# Patient Record
Sex: Female | Born: 1955 | Race: White | Hispanic: No | Marital: Married | Smoking: Never smoker
Health system: Southern US, Community
[De-identification: ages and names within clinical notes are randomized; demographics above are authoritative.]

## PROBLEM LIST (undated history)

## (undated) DIAGNOSIS — E039 Hypothyroidism, unspecified: Secondary | ICD-10-CM

## (undated) DIAGNOSIS — Z820 Family history of epilepsy and other diseases of the nervous system: Secondary | ICD-10-CM

## (undated) DIAGNOSIS — L719 Rosacea, unspecified: Secondary | ICD-10-CM

## (undated) DIAGNOSIS — Z79890 Hormone replacement therapy (postmenopausal): Secondary | ICD-10-CM

## (undated) DIAGNOSIS — C439 Malignant melanoma of skin, unspecified: Secondary | ICD-10-CM

## (undated) DIAGNOSIS — J383 Other diseases of vocal cords: Secondary | ICD-10-CM

## (undated) DIAGNOSIS — C189 Malignant neoplasm of colon, unspecified: Secondary | ICD-10-CM

## (undated) DIAGNOSIS — I499 Cardiac arrhythmia, unspecified: Secondary | ICD-10-CM

## (undated) HISTORY — DX: Rosacea, unspecified: L71.9

## (undated) HISTORY — DX: Cardiac arrhythmia, unspecified: I49.9

## (undated) HISTORY — DX: Family history of epilepsy and other diseases of the nervous system: Z82.0

## (undated) HISTORY — DX: Hormone replacement therapy: Z79.890

## (undated) HISTORY — DX: Other diseases of vocal cords: J38.3

## (undated) HISTORY — DX: Malignant melanoma of skin, unspecified: C43.9

## (undated) HISTORY — PX: ENDOMETRIAL ABLATION: SHX621

## (undated) HISTORY — DX: Malignant neoplasm of colon, unspecified: C18.9

## (undated) HISTORY — PX: SKIN CANCER EXCISION: SHX779

## (undated) HISTORY — PX: VOCAL CORD INJECTION: SHX2663

## (undated) HISTORY — PX: TUBAL LIGATION: SHX77

## (undated) HISTORY — DX: Hypothyroidism, unspecified: E03.9

---

## 1987-08-03 HISTORY — PX: CHOLECYSTECTOMY: SHX55

## 2004-08-02 HISTORY — PX: OTHER SURGICAL HISTORY: SHX169

## 2011-05-13 ENCOUNTER — Other Ambulatory Visit (HOSPITAL_COMMUNITY)
Admission: RE | Admit: 2011-05-13 | Discharge: 2011-05-13 | Disposition: A | Discharge status: Home / Self Care | Payer: BC Managed Care – PPO | Source: Ambulatory Visit | Attending: Obstetrics and Gynecology | Admitting: Obstetrics and Gynecology

## 2011-05-13 ENCOUNTER — Other Ambulatory Visit: Payer: Self-pay | Admitting: Adult Health

## 2011-05-13 DIAGNOSIS — Z01419 Encounter for gynecological examination (general) (routine) without abnormal findings: Secondary | ICD-10-CM | POA: Insufficient documentation

## 2011-05-13 DIAGNOSIS — Z139 Encounter for screening, unspecified: Secondary | ICD-10-CM

## 2011-05-25 ENCOUNTER — Ambulatory Visit (HOSPITAL_COMMUNITY): Payer: Self-pay

## 2011-05-25 ENCOUNTER — Ambulatory Visit (HOSPITAL_COMMUNITY)
Admission: RE | Admit: 2011-05-25 | Discharge: 2011-05-25 | Disposition: A | Discharge status: Home / Self Care | Payer: BC Managed Care – PPO | Source: Ambulatory Visit | Attending: Adult Health | Admitting: Adult Health

## 2011-05-25 DIAGNOSIS — Z1231 Encounter for screening mammogram for malignant neoplasm of breast: Secondary | ICD-10-CM | POA: Insufficient documentation

## 2011-05-25 DIAGNOSIS — Z139 Encounter for screening, unspecified: Secondary | ICD-10-CM

## 2012-05-18 ENCOUNTER — Other Ambulatory Visit (HOSPITAL_COMMUNITY)
Admission: RE | Admit: 2012-05-18 | Discharge: 2012-05-18 | Disposition: A | Discharge status: Home / Self Care | Payer: BC Managed Care – PPO | Source: Ambulatory Visit | Attending: Obstetrics and Gynecology | Admitting: Obstetrics and Gynecology

## 2012-05-18 DIAGNOSIS — Z1151 Encounter for screening for human papillomavirus (HPV): Secondary | ICD-10-CM | POA: Insufficient documentation

## 2012-05-18 DIAGNOSIS — Z01419 Encounter for gynecological examination (general) (routine) without abnormal findings: Secondary | ICD-10-CM | POA: Insufficient documentation

## 2012-05-22 ENCOUNTER — Other Ambulatory Visit: Payer: Self-pay | Admitting: Adult Health

## 2012-05-22 DIAGNOSIS — Z139 Encounter for screening, unspecified: Secondary | ICD-10-CM

## 2012-05-30 ENCOUNTER — Ambulatory Visit (HOSPITAL_COMMUNITY)
Admission: RE | Admit: 2012-05-30 | Discharge: 2012-05-30 | Disposition: A | Discharge status: Home / Self Care | Payer: BC Managed Care – PPO | Source: Ambulatory Visit | Attending: Adult Health | Admitting: Adult Health

## 2012-05-30 DIAGNOSIS — Z139 Encounter for screening, unspecified: Secondary | ICD-10-CM

## 2012-05-30 DIAGNOSIS — Z1231 Encounter for screening mammogram for malignant neoplasm of breast: Secondary | ICD-10-CM | POA: Insufficient documentation

## 2013-03-07 ENCOUNTER — Encounter: Payer: Self-pay | Admitting: *Deleted

## 2013-03-12 ENCOUNTER — Telehealth: Payer: Self-pay | Admitting: Gastroenterology

## 2013-03-12 ENCOUNTER — Ambulatory Visit (INDEPENDENT_AMBULATORY_CARE_PROVIDER_SITE_OTHER): Payer: BC Managed Care – PPO | Admitting: Family Medicine

## 2013-03-12 ENCOUNTER — Encounter: Payer: Self-pay | Admitting: Family Medicine

## 2013-03-12 VITALS — BP 128/82 | Ht 63.0 in | Wt 159.4 lb

## 2013-03-12 DIAGNOSIS — E039 Hypothyroidism, unspecified: Secondary | ICD-10-CM | POA: Insufficient documentation

## 2013-03-12 DIAGNOSIS — Z85038 Personal history of other malignant neoplasm of large intestine: Secondary | ICD-10-CM | POA: Insufficient documentation

## 2013-03-12 DIAGNOSIS — M25551 Pain in right hip: Secondary | ICD-10-CM

## 2013-03-12 DIAGNOSIS — Z0000 Encounter for general adult medical examination without abnormal findings: Secondary | ICD-10-CM

## 2013-03-12 DIAGNOSIS — M25559 Pain in unspecified hip: Secondary | ICD-10-CM

## 2013-03-12 DIAGNOSIS — G8929 Other chronic pain: Secondary | ICD-10-CM

## 2013-03-12 MED ORDER — LEVOTHYROXINE SODIUM 100 MCG PO TABS: 100.0000 ug | ORAL_TABLET | Freq: Every day | ORAL | Status: DC

## 2013-03-12 NOTE — Progress Notes (Signed)
  Subjective:    Patient ID: Jane Mclean, female    DOB: 06-08-1956, 57 y.o.   MRN: [id number]  HPI Right hip and back discomfort, over one year,  Given voltaren about a year ago.  Feels like a nerve, pressure discomfort  Tender to touch waking her up,  Shifts positions  Not much per day, sleeps on right side.  Patch of dry skin irritated itchy. Hx of rosacea, hx of basal cell ca  History of low thyroid. Claims compliance with her medicines.  Review of Systems No symptoms of high or low thyroid. No chest pain no change in bowel habits no blood in stool no headache ROS otherwise negative    Objective:   Physical Exam  Alert HEENT normal. Thyroid nonpalpable. Lungs clear. Heart regular in rhythm. Right temple small patch of eczema. Low back negative sciatic notch tenderness. Negative straight leg raise. Positive right lateral hip tenderness to deep palpation      Assessment & Plan:  Impression 1 eczema discussed. #2 hypothyroidism discussed. #3 lateral trochanteric bursitis has had for over one year discussed. #4 history of colon cancer 2006 within the polyp no extension wonders when she needs her next the scope. Plan thyroid medicine refilled. Appropriate blood work. Hydrocortisone twice a day to skin. To see dermatologist due to history of basal cell cancer. Or so consult. WSL

## 2013-03-12 NOTE — Telephone Encounter (Signed)
A nurse from Dr Fletcher Anon office called to see when patient is due for her next tcs. She said SF did her last one, but I am not finding anything in epic or echart where we have ever seen this patient. I told nurse that I would check with our nurse and SF to see what they advise because I may need to retrieve patient's chart out of storage.

## 2013-03-12 NOTE — Telephone Encounter (Signed)
I could not find anything. I called MR and spoke to [facility] and she is checking on it for me. Will let me know if she finds something.

## 2013-03-13 NOTE — Telephone Encounter (Signed)
T/C from [facility]. She cannot find anything on this pt having a TCS. I called pt and LM for a return call.

## 2013-03-13 NOTE — Telephone Encounter (Signed)
I called Dr. Fletcher Anon office and spoke to Autumn. She said that the pt said that she had colon cancer in 2006 and follow up in 2009 by SF. She did not have any documention for that office, she just told them. I will ask the pt was it under a different name when she returns my call.

## 2013-03-19 NOTE — Telephone Encounter (Signed)
LMOM to call.

## 2013-03-20 NOTE — Telephone Encounter (Signed)
LMOM for pt to call. 

## 2013-03-22 NOTE — Telephone Encounter (Signed)
Mailed a letter for pt to call and give a little more info.

## 2013-03-27 ENCOUNTER — Ambulatory Visit (INDEPENDENT_AMBULATORY_CARE_PROVIDER_SITE_OTHER): Payer: BC Managed Care – PPO

## 2013-03-27 ENCOUNTER — Encounter: Payer: Self-pay | Admitting: Orthopedic Surgery

## 2013-03-27 ENCOUNTER — Ambulatory Visit (INDEPENDENT_AMBULATORY_CARE_PROVIDER_SITE_OTHER): Payer: BC Managed Care – PPO | Admitting: Orthopedic Surgery

## 2013-03-27 VITALS — BP 126/77 | Ht 63.0 in | Wt 159.0 lb

## 2013-03-27 DIAGNOSIS — M25559 Pain in unspecified hip: Secondary | ICD-10-CM

## 2013-03-27 DIAGNOSIS — M25551 Pain in right hip: Secondary | ICD-10-CM

## 2013-03-27 DIAGNOSIS — M5410 Radiculopathy, site unspecified: Secondary | ICD-10-CM

## 2013-03-27 DIAGNOSIS — M7071 Other bursitis of hip, right hip: Secondary | ICD-10-CM | POA: Insufficient documentation

## 2013-03-27 DIAGNOSIS — M76899 Other specified enthesopathies of unspecified lower limb, excluding foot: Secondary | ICD-10-CM

## 2013-03-27 DIAGNOSIS — M549 Dorsalgia, unspecified: Secondary | ICD-10-CM

## 2013-03-27 DIAGNOSIS — IMO0002 Reserved for concepts with insufficient information to code with codable children: Secondary | ICD-10-CM

## 2013-03-27 MED ORDER — GABAPENTIN 100 MG PO CAPS: 100.0000 mg | ORAL_CAPSULE | Freq: Every day | ORAL | Status: DC

## 2013-03-27 NOTE — Patient Instructions (Addendum)
Therapy   Radicular Pain Radicular pain in either the arm or leg is usually from a bulging or herniated disk in the spine. A piece of the herniated disk may press against the nerves as the nerves exit the spine. This causes pain which is felt at the tips of the nerves down the arm or leg. Other causes of radicular pain may include:  Fractures.  Heart disease.  Cancer.  An abnormal and usually degenerative state of the nervous system or nerves (neuropathy). Diagnosis may require CT or MRI scanning to determine the primary cause.  Nerves that start at the neck (nerve roots) may cause radicular pain in the outer shoulder and arm. It can spread down to the thumb and fingers. The symptoms vary depending on which nerve root has been affected. In most cases radicular pain improves with conservative treatment. Neck problems may require physical therapy, a neck collar, or cervical traction. Treatment may take many weeks, and surgery may be considered if the symptoms do not improve.  Conservative treatment is also recommended for sciatica. Sciatica causes pain to radiate from the lower back or buttock area down the leg into the foot. Often there is a history of back problems. Most patients with sciatica are better after 2 to 4 weeks of rest and other supportive care. Short term bed rest can reduce the disk pressure considerably. Sitting, however, is not a good position since this increases the pressure on the disk. You should avoid bending, lifting, and all other activities which make the problem worse. Traction can be used in severe cases. Surgery is usually reserved for patients who do not improve within the first months of treatment. Only take over-the-counter or prescription medicines for pain, discomfort, or fever as directed by your caregiver. Narcotics and muscle relaxants may help by relieving more severe pain and spasm and by providing mild sedation. Cold or massage can give significant relief. Spinal  manipulation is not recommended. It can increase the degree of disc protrusion. Epidural steroid injections are often effective treatment for radicular pain. These injections deliver medicine to the spinal nerve in the space between the protective covering of the spinal cord and back bones (vertebrae). Your caregiver can give you more information about steroid injections. These injections are most effective when given within two weeks of the onset of pain.  You should see your caregiver for follow up care as recommended. A program for neck and back injury rehabilitation with stretching and strengthening exercises is an important part of management.  SEEK IMMEDIATE MEDICAL CARE IF:  You develop increased pain, weakness, or numbness in your arm or leg.  You develop difficulty with bladder or bowel control.  You develop abdominal pain. Document Released: 08/26/2004 Document Revised: 10/11/2011 Document Reviewed: 11/11/2008 [facility] Patient Information 2014 [place], [place]. Degenerative Disk Disease Degenerative disk disease is a condition caused by the changes that occur in the cushions of the backbone (spinal disks) as you grow older. Spinal disks are soft and compressible disks located between the bones of the spine (vertebrae). They act like shock absorbers. Degenerative disk disease can affect the whole spine. However, the neck and lower back are most commonly affected. Many changes can occur in the spinal disks with aging, such as:  The spinal disks may dry and shrink.  Small tears may occur in the tough, outer covering of the disk (annulus).  The disk space may become smaller due to loss of water.  Abnormal growths in the bone (spurs) may occur. This  can put pressure on the nerve roots exiting the spinal canal, causing pain.  The spinal canal may become narrowed. CAUSES  Degenerative disk disease is a condition caused by the changes that occur in the spinal disks with aging. The exact  cause is not known, but there is a genetic basis for many patients. Degenerative changes can occur due to loss of fluid in the disk. This makes the disk thinner and reduces the space between the backbones. Small cracks can develop in the outer layer of the disk. This can lead to the breakdown of the disk. You are more likely to get degenerative disk disease if you are overweight. Smoking cigarettes and doing heavy work such as weightlifting can also increase your risk of this condition. Degenerative changes can start after a sudden injury. Growth of bone spurs can compress the nerve roots and cause pain.  SYMPTOMS  The symptoms vary from person to person. Some people may have no pain, while others have severe pain. The pain may be so severe that it can limit your activities. The location of the pain depends on the part of your backbone that is affected. You will have neck or arm pain if a disk in the neck area is affected. You will have pain in your back, buttocks, or legs if a disk in the lower back is affected. The pain becomes worse while bending, reaching up, or with twisting movements. The pain may start gradually and then get worse as time passes. It may also start after a major or minor injury. You may feel numbness or tingling in the arms or legs.  DIAGNOSIS  Your caregiver will ask you about your symptoms and about activities or habits that may cause the pain. He or she may also ask about any injuries, diseases, or treatments you have had earlier. Your caregiver will examine you to check for the range of movement that is possible in the affected area, to check for strength in your extremities, and to check for sensation in the areas of the arms and legs supplied by different nerve roots. An X-ray of the spine may be taken. Your caregiver may suggest other imaging tests, such as magnetic resonance imaging (MRI), if needed.  TREATMENT  Treatment includes rest, modifying your activities, and applying ice  and heat. Your caregiver may prescribe medicines to reduce your pain and may ask you to do some exercises to strengthen your back. In some cases, you may need surgery. You and your caregiver will decide on the treatment that is best for you. HOME CARE INSTRUCTIONS   Follow proper lifting and walking techniques as advised by your caregiver.  Maintain good posture.  Exercise regularly as advised.  Perform relaxation exercises.  Change your sitting, standing, and sleeping habits as advised. Change positions frequently.  Lose weight as advised.  Stop smoking if you smoke.  Wear supportive footwear. SEEK MEDICAL CARE IF:  Your pain does not go away within 1 to 4 weeks. SEEK IMMEDIATE MEDICAL CARE IF:   Your pain is severe.  You notice weakness in your arms, hands, or legs.  You begin to lose control of your bladder or bowel movements. MAKE SURE YOU:   Understand these instructions.  Will watch your condition.  Will get help right away if you are not doing well or get worse. Document Released: 05/16/2007 Document Revised: 10/11/2011 Document Reviewed: 05/16/2007 [facility] Patient Information 2014 [place], [place].

## 2013-03-27 NOTE — Progress Notes (Signed)
Patient ID: Jane Mclean, female   DOB: 23-Nov-1955, 57 y.o.   MRN: [id number]   Chief Complaint  Patient presents with  . Hip Pain    Right hip pain, no injury. Referred by [facility].S.Luking    BP 126/77  Ht 5\' 3"  (1.6 m)  Wt 159 lb (72.122 kg)  BMI 28.17 kg/m2  History this patient was referred to [place] by Dr. Ardyth Gal for chronic right trochanter bursitis. Patient has had symptoms for approximately one year received one cortisone injection in the hip area. She complains of dull throbbing 5/10 intermittent pain after sitting for too long. It is better with certain positions is worse with possibly stress she also feels some tingling in the right hip down to the right knee and occasionally to the right foot symptoms seem to be worse at night pain level reaches 5/10  She denies any significant back pain but has no pain if she sits in the car for a long ride  Review of systems negative except for history of depression 15 years ago the tingling we talked about and she has a penicillin allergy but no food allergy and no seasonal allergies  Is allergic to penicillin and per him  Surgery includes a ganglion cyst, cholecystectomy, cesarean section and partial colectomy as well as a tubal ligation  Medications are Synthroid and progesterone she also uses a patch  Family history arthritis cancer and asthma  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  BP 126/77  Ht 5\' 3"  (1.6 m)  Wt 159 lb (72.122 kg)  BMI 28.17 kg/m2 General appearance is normal, the patient is alert and oriented x3 with normal mood and affect. Body habitus is normal  She ambulates normally posture is normal  She is tender over both greater trochanters but the right leg more significant  The hips are stable and there is no atrophy in either limb limb alignment looks normal leg lengths are equal and the skin of both lower extremities remains normal. The skin overlying the back is  normal as well. Cardiovascular: good distal pulses, normal sensation negative straight leg raises and no lymphadenopathy in the lower extremities   The hip joints have full and equal range of motion without restriction or pain  She does have tender in the right lower buttock area and right side of her back  Hip x-rays normal  Encounter Diagnoses  Name Primary?  . Right hip pain   . Back pain   . Radicular pain Yes  . Bursitis of right hip     We recommend injection right hip for bursitis And institute lumbar spine stabilization Add gabapentin at night

## 2013-03-28 NOTE — Telephone Encounter (Signed)
PLEASE CALL PT. IF HAD A CANCEROUS POLYP REMOVED IN 2012 SHE SHOULD'VE HAD A TCS IN 2013. IF SHE DID NOT, SHE NEEDS A TCS IN 2014. SHE SHOULD STOP BY THE OFFICE TO SIGN A MEDICAL RELEASE SO WE CAN OBTAIN THE PATH AND OP NOTES.  SHE SHOULD FOLLOW A HIGH FIBER DIET. ALL SISTERS, BROTHERS, CHILDREN, AND PARENTS NEED TO HAVE A COLONOSCOPY STARTING AT THE AGE OF 40 AND THEN EVERY 5 YEARS.Marland Kitchen

## 2013-03-28 NOTE — Telephone Encounter (Addendum)
PLEASE CALL PT. Her NEXT TCS should be in 2015.

## 2013-03-28 NOTE — Telephone Encounter (Signed)
REVIEWED.  

## 2013-03-28 NOTE — Telephone Encounter (Signed)
Called pt. She said her cancerous polyp was removed in 2006. Then she had a colonoscopy every year til 2010. In 2010 her next was recommended for 2012 and that is when her last one was done. In 2012, it was recommended her next one in 3-4 years.  Please advise!

## 2013-03-28 NOTE — Telephone Encounter (Signed)
LMOM to call.

## 2013-03-28 NOTE — Telephone Encounter (Signed)
Pt returned call and was informed. She will call me in Dec 2014.

## 2013-03-28 NOTE — Telephone Encounter (Signed)
Pt received a letter and returned call. She apologized for taking so long but she works at [employer] and it has been a busy time.  She said she was sorry for the misunderstanding. She has never been to our office or seen one of our physicians. She moved here from [place] and her last colonoscoy was Jan. 2012, and she was told to have her next one in 3-4 years. 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 She had a VERY LARGE polyp and had to have 8 in of her colon removed with the large polyp which was cancerous.  She said Jan 2015 would be 3 years, and should she just wait until then to come in and get set up for next colonoscopy. She is not having any problems at this time.  Please advise!

## 2013-04-03 LAB — LIPID PANEL
HDL: 61 mg/dL (ref >39)
LDL Cholesterol: 73 mg/dL (ref 0–99)
Total CHOL/HDL Ratio: 2.4 Ratio

## 2013-04-03 LAB — GLUCOSE, RANDOM: Glucose, Bld: 83 mg/dL (ref 70–99)

## 2013-04-03 LAB — TSH: TSH: 2.334 u[IU]/mL (ref 0.350–4.500)

## 2013-04-05 ENCOUNTER — Ambulatory Visit (HOSPITAL_COMMUNITY)
Admission: RE | Admit: 2013-04-05 | Discharge: 2013-04-05 | Disposition: A | Discharge status: Home / Self Care | Payer: BC Managed Care – PPO | Source: Ambulatory Visit | Attending: Orthopedic Surgery | Admitting: Orthopedic Surgery

## 2013-04-05 DIAGNOSIS — M25559 Pain in unspecified hip: Secondary | ICD-10-CM | POA: Insufficient documentation

## 2013-04-05 DIAGNOSIS — IMO0001 Reserved for inherently not codable concepts without codable children: Secondary | ICD-10-CM | POA: Insufficient documentation

## 2013-04-05 DIAGNOSIS — M545 Low back pain, unspecified: Secondary | ICD-10-CM | POA: Insufficient documentation

## 2013-04-05 DIAGNOSIS — M533 Sacrococcygeal disorders, not elsewhere classified: Secondary | ICD-10-CM | POA: Insufficient documentation

## 2013-04-05 NOTE — Evaluation (Signed)
Physical Therapy Evaluation  Patient Details  Name: Jane Mclean MRN: [id number] Date of Birth: 1955/11/11  Today's Date: 04/05/2013 Time: 1515-1600 PT Time Calculation (min): 45 min Charges: 1 evaluation              Visit#: 1 of 8  Re-eval: 05/05/13 Assessment Diagnosis: Low back pain/Rt hip pain Next MD Visit: Dr. Romeo Apple  Past Medical History:  Past Medical History  Diagnosis Date  . Colon cancer   . Hypothyroid    Past Surgical History:  Past Surgical History  Procedure Laterality Date  . Tubal ligation    . Cesarean section      Subjective Symptoms/Limitations Symptoms: Pt is a 57 year old female referred to PT for Rt hip and low back pain.   She reports it is worse at night time and does not know how long it has been bothering her.  She knows she has had bursitis in her hips for many years.  She more recently has tingling down her leg.  She has been taking neurton which helps her sleep at night time.  Also has complaints of tingling to the side of her Rt leg and into her Lt posterior leg.  Pain Assessment Currently in Pain?: Yes Pain Score: 6  Pain Location: Back Pain Type: Acute pain  Precautions/Restrictions     Balance Screening Balance Screen Has the patient fallen in the past 6 months: No Has the patient had a decrease in activity level because of a fear of falling? : Yes Is the patient reluctant to leave their home because of a fear of falling? : No  Prior Functioning  Prior Function Vocation: Full time employment Vocation Requirements: Conservator, museum/gallery for [employer] Comments: Attends Zumba class  Cognition/Observation Observation/Other Assessments Observations: Rt SI anteior rotation, Rt iliac anterior rotation  Sensation/Coordination/Flexibility/Functional Tests Coordination Gross Motor Movements are Fluid and Coordinated: No Coordination and Movement Description: normal to multifidus and impaired to transevese abdominous (TrA) with mod VC  and visual cueing and tactile cueing for activaiton Flexibility Obers: Positive 90/90: Positive Functional Tests Functional Tests: - ASLR, - SLR,  Functional Tests: + Rt FABER  Assessment RLE Assessment RLE Assessment: Within Functional Limits LLE Assessment LLE Assessment: Within Functional Limits Lumbar AROM Lumbar Flexion: WNL Lumbar Extension: WNL Lumbar - Right Side Bend: WNL - pain Lumbar - Left Side Bend: WNL - mild pain Lumbar - Right Rotation: WNL Lumbar - Left Rotation: WNL Palpation Palpation: mod to max fascial restrictions to Rt piriformis and ITB,pain and tenderness to Rt ITB insertion and origin.  allodynia to Rt ASIS and pelvic region  and posterior Rt leg with superficial palpation.   Exercise/Treatments Mobility/Balance      Stretches   Aerobic   Machines for Strengthening   Standing   Seated   Supine Ab Set: 5 reps;5 seconds Bent Knee Raise: 5 reps;2 seconds Sidelying   Prone  Single Arm Raise: Right;Left;5 reps Straight Leg Raise: 5 reps Opposite Arm/Leg Raise: Right arm/Left leg;Left arm/Right leg;5 reps Quadruped    Manual Therapy Manual Therapy: Other (comment) Other Manual Therapy: Muscle energy technique: correct Rt anterior rotation  Physical Therapy Assessment and Plan PT Assessment and Plan Clinical Impression Statement: Pt is a 57 year old female referred to PT for Rt hip bursitis and low back pain w/radiculopathy with impairments listed below.  After evaluation it was found she has SI alignment dysfunction and allodynia w/superficial palpation to Rt ASIS and pelvic region. Able to correct alignment to WNL and  had decreased piriformis pain after, continues to be limted by her pain to ITB region.  Pt will benefit from skilled therapeutic intervention in order to improve on the following deficits: Pain;Increased fascial restricitons;Improper spinal/pelvic alignment Rehab Potential: Good PT Frequency: Min 2X/week PT Duration: 4  weeks PT Treatment/Interventions: Therapeutic activities;Therapeutic exercise;Neuromuscular re-education;Manual techniques PT Plan: No modalities, hx of cancer.  Continue with MET for SI correction and educate on proper core activation and exercises. Continue to progress towards functional goals for her to return to zumba.     Goals Home Exercise Program Pt/caregiver will Perform Home Exercise Program: Independently PT Goal: Perform Home Exercise Program - Progress: Goal set today PT Short Term Goals Time to Complete Short Term Goals: 2 weeks PT Short Term Goal 1: Pt will present with normalized SI alignment in order to report pain less than a 3/10 for 50% of her day to her Rt hip region.  PT Short Term Goal 2: Pt will present with minimal fascial restrictions to her Rt gluteal and ITB for decreased pain.  PT Short Term Goal 3: Pt will improve her core coordination and require min cueing with PT faciliation for TrA contraction to maintain SI alignment.  PT Long Term Goals Time to Complete Long Term Goals: 4 weeks PT Long Term Goal 1: Pt will be independent with an adavanced HEP for core and lumbar stabalization and education on SI self coreection techniques.  PT Long Term Goal 2: Pt will improve present without pain to her Rt hip and report greater ease with zumba class. Long Term Goal 3: Pt will be independent with core exercises in order to maintain proper SI aliginment.   Problem List Patient Active Problem List   Diagnosis Date Noted  . 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 Radicular pain 03/27/2013  . Right hip pain 03/27/2013  . Bursitis of right hip 03/27/2013  . Back pain 03/27/2013  . Unspecified hypothyroidism 03/12/2013  . History of colon cancer 03/12/2013    PT - End of Session Activity Tolerance: Patient tolerated treatment well PT Plan of Care PT Home Exercise Plan: given PT Patient Instructions: importance of HEP  Explained SI alignment and core muscles to maintain  SI alignment.  Consulted and Agree with Plan of Care: Patient  GP    Annett Fabian, MPT, ATC 04/05/2013, 4:15 PM  Physician Documentation Your signature is required to indicate approval of the treatment plan as stated above.  Please sign and either send electronically or make a copy of this report for your files and return this physician signed original.   Please mark one 1.__approve of plan  2. ___approve of plan with the following conditions.   ______________________________                                                          _____________________ Physician Signature  Date  

## 2013-04-10 ENCOUNTER — Ambulatory Visit (HOSPITAL_COMMUNITY)
Admission: RE | Admit: 2013-04-10 | Discharge: 2013-04-10 | Disposition: A | Discharge status: Home / Self Care | Payer: BC Managed Care – PPO | Source: Ambulatory Visit | Attending: Family Medicine | Admitting: Family Medicine

## 2013-04-10 DIAGNOSIS — M25551 Pain in right hip: Secondary | ICD-10-CM

## 2013-04-10 DIAGNOSIS — M7071 Other bursitis of hip, right hip: Secondary | ICD-10-CM

## 2013-04-10 DIAGNOSIS — M549 Dorsalgia, unspecified: Secondary | ICD-10-CM

## 2013-04-10 NOTE — Progress Notes (Signed)
Physical Therapy Treatment Patient Details  Name: Jane Mclean MRN: [id number] Date of Birth: 1956-02-25  Today's Date: 04/10/2013 Time: 9147-8295 PT Time Calculation (min): 38 min Charge; TE 6213-0865  Visit#: 2 of 8  Re-eval: 05/05/13 Assessment Diagnosis: Low back pain/Rt hip pain Next MD Visit: Dr. Romeo Apple 05/29/2013  Subjective: Symptoms/Limitations Symptoms: Pt reported pain free at entrance today, stated she feels the MET last session helped alot.  Pt reported completing HEP a couple of times at home.  Precautions/Restrictions  Precautions Precautions: Other (comment) Precaution Comments: hx cancer  Exercise/Treatments Stretches Passive Hamstring Stretch: 3 reps;30 seconds;Limitations Passive Hamstring Stretch Limitations: with rope Lower Trunk Rotation: 5 reps;10 seconds Supine Ab Set: 10 reps;Limitations AB Set Limitations: 10" holds Bent Knee Raise: 10 reps;5 seconds Bridge: 10 reps;Limitations Bridge Limitations: on green ball. Straight Leg Raise: 10 reps;Limitations Straight Leg Raises Limitations: floating  Prone  Single Arm Raise: Limitations Single Arm Raises Limitations: 1 rep Straight Leg Raise: Limitations Straight Leg Raises Limitations: 1 reps Opposite Arm/Leg Raise: Right arm/Left leg;Left arm/Right leg;5 reps  Manual Therapy Manual Therapy: Other (comment) Other Manual Therapy: Check for SI alignment, no MET required.  Physical Therapy Assessment and Plan PT Assessment and Plan Clinical Impression Statement: SI within alignment, no MET required.  Session focus on educating pt importance of core strengthening to assure SI stays intact.  Pt able to demonstrate appropriate technique with all exercises with min cueing required.   PT Plan: No modalities, hx of cancer.  Continue with MET for SI correction and educate on proper core activation and exercises. Continue to progress towards functional goals for her to return to zumba. Begin standing  exercises next session.      Goals Home Exercise Program Pt/caregiver will Perform Home Exercise Program: Independently PT Short Term Goals Time to Complete Short Term Goals: 2 weeks PT Short Term Goal 1: Pt will present with normalized SI alignment in order to report pain less than a 3/10 for 50% of her day to her Rt hip region.  PT Short Term Goal 1 - Progress: Progressing toward goal PT Short Term Goal 2: Pt will present with minimal fascial restrictions to her Rt gluteal and ITB for decreased pain.  PT Short Term Goal 3: Pt will improve her core coordination and require min cueing with PT faciliation for TrA contraction to maintain SI alignment.  PT Short Term Goal 3 - Progress: Progressing toward goal PT Long Term Goals Time to Complete Long Term Goals: 4 weeks PT Long Term Goal 1: Pt will be independent with an adavanced HEP for core and lumbar stabalization and education on SI self coreection techniques.  PT Long Term Goal 2: Pt will improve present without pain to her Rt hip and report greater ease with zumba class. Long Term Goal 3: Pt will be independent with core exercises in order to maintain proper SI aliginment.   Problem List Patient Active Problem List   Diagnosis Date Noted  . 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 Radicular pain 03/27/2013  . Right hip pain 03/27/2013  . Bursitis of right hip 03/27/2013  . Back pain 03/27/2013  . Unspecified hypothyroidism 03/12/2013  . History of colon cancer 03/12/2013    PT - End of Session Activity Tolerance: Patient tolerated treatment well General Behavior During Therapy: [facility] for tasks assessed/performed  GP    Juel Burrow 04/10/2013, 3:52 PM

## 2013-04-12 ENCOUNTER — Ambulatory Visit (HOSPITAL_COMMUNITY)
Admission: RE | Admit: 2013-04-12 | Discharge: 2013-04-12 | Disposition: A | Discharge status: Home / Self Care | Payer: BC Managed Care – PPO | Source: Ambulatory Visit | Attending: Family Medicine | Admitting: Family Medicine

## 2013-04-12 NOTE — Progress Notes (Signed)
Physical Therapy Treatment Patient Details  Name: Jane Mclean MRN: [id number] Date of Birth: 09-Dec-1955  Today's Date: 04/12/2013 Time: 4098-1191 PT Time Calculation (min): 40 min Visit#: 3 of 8  Re-eval: 05/05/13 Charges;  manual 1515-1525 (10'), therex 1526-1555 (29')  Subjective: Symptoms/Limitations Symptoms: Pt states she is having increased pain today.  STates she must have slept wrong last night because pain returned this morning.   Exercise/Treatments Stretches Passive Hamstring Stretch: 30 seconds;Limitations;2 reps Passive Hamstring Stretch Limitations: with rope Aerobic Tread Mill: 5'  at 1. after MET Supine Ab Set: 10 reps;Limitations AB Set Limitations: 10" holds Bent Knee Raise: 10 reps;5 seconds Bridge: 10 reps Bridge Limitations: L LE only Straight Leg Raise: 10 reps;Limitations Straight Leg Raises Limitations: floating  Prone  Single Arm Raise: 5 reps Straight Leg Raise: 5 reps Opposite Arm/Leg Raise: 5 reps Other Prone Lumbar Exercises: heelsqueezes 10X5"   Manual Therapy Manual Therapy: Other (comment) Other Manual Therapy: MET for Rt anterior rotation of SI joint in supine  Physical Therapy Assessment and Plan PT Assessment and Plan Clinical Impression Statement: Rt SI with anterior rotation requiring MET.  Instructed pt how to perform self MET at home and to perform single leg bridging with Lt LE to help increase Lt hip strength.  Pt with noted weakness in Rt>Lt LE's .  SI remained intact throughout treatment and pt painfree at end of session. PT Plan: No modalities, hx of cancer.  Continue with MET for SI correction and educate on proper core activation and exercises. Continue to progress towards functional goals for her to return to zumba. Begin standing exercises next session.       Problem List Patient Active Problem List   Diagnosis Date Noted  . 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 Radicular pain 03/27/2013  . Right hip pain  03/27/2013  . Bursitis of right hip 03/27/2013  . Back pain 03/27/2013  . Unspecified hypothyroidism 03/12/2013  . History of colon cancer 03/12/2013    PT - End of Session Activity Tolerance: Patient tolerated treatment well General Behavior During Therapy: [facility] for tasks assessed/performed   Lurena Nida, PTA/CLT 04/12/2013, 5:15 PM

## 2013-04-17 ENCOUNTER — Ambulatory Visit (HOSPITAL_COMMUNITY)
Admission: RE | Admit: 2013-04-17 | Discharge: 2013-04-17 | Disposition: A | Discharge status: Home / Self Care | Payer: BC Managed Care – PPO | Source: Ambulatory Visit | Attending: Family Medicine | Admitting: Family Medicine

## 2013-04-17 DIAGNOSIS — M25551 Pain in right hip: Secondary | ICD-10-CM

## 2013-04-17 DIAGNOSIS — M549 Dorsalgia, unspecified: Secondary | ICD-10-CM

## 2013-04-17 DIAGNOSIS — M7071 Other bursitis of hip, right hip: Secondary | ICD-10-CM

## 2013-04-17 NOTE — Progress Notes (Signed)
Physical Therapy Treatment Patient Details  Name: Jane Mclean MRN: [id number] Date of Birth: 08/12/1955  Today's Date: 04/17/2013 Time: 1520-1605 PT Time Calculation (min): 45 min Charge: Manual 1520-1535, TE 1535-1555, Ice massage x 4098-1191  Visit#: 4 of 8  Re-eval: 05/05/13 Assessment Diagnosis: Low back pain/Rt hip pain Next MD Visit: Dr. Romeo Apple 05/29/2013  Subjective: Symptoms/Limitations Symptoms: Pt feels like her bursitis is acting up today, does feel that her SI feels aligned today. Pain Assessment Currently in Pain?: Yes Pain Score: 2  Pain Location: Hip Pain Orientation: Right  Precautions/Restrictions  Precautions Precautions: Other (comment) Precaution Comments: hx cancer  Exercise/Treatments Aerobic Tread Mill: 5' total 2'30" forward at 3.0, backwards @1 .5 knees bent with incline 5 to set SI alignment. Standing Functional Squats: 10 reps;5 seconds Other Standing Lumbar Exercises: SLS 45" max of 3 Other Standing Lumbar Exercises: vector stance 3x 5" Supine Bent Knee Raise: 10 reps;5 seconds;Limitations Bent Knee Raise Limitations: with ab set Bridge: 10 reps Bridge Limitations: Rt and Lt LE only Prone  Single Arm Raise: 5 reps Straight Leg Raise: 5 reps Opposite Arm/Leg Raise: 5 reps  Modalities Modalities: Cryotherapy Manual Therapy Manual Therapy: Other (comment) Other Manual Therapy: MET for Lt SI anterior rotation f/b gait on TM to set alignment Cryotherapy Number Minutes Cryotherapy: 6 Minutes Cryotherapy Location:  (Rt hip with Lt S/L) Type of Cryotherapy: Ice massage  Physical Therapy Assessment and Plan PT Assessment and Plan Clinical Impression Statement: MET for Lt anterior rotation f/b TM to set alignement with pain reduced and improved gait mechanics.  Pt demonstrated proper technique with all therex, progress to standing exercises for LE strengthening and balance training to reduce risk of future injury.  Pt educated on  beneifts of ice for bursitis, completed ice massage end of session for pain control.  Pt encouraged to apply ice at home for edema and pain control Rt hip.   PT Plan: No modalities, hx of cancer.  Continue with MET for SI correction and educate on proper core activation and exercises. Continue to progress towards functional goals for her to return to zumba. Continue with standing stability exercises.      Goals Home Exercise Program Pt/caregiver will Perform Home Exercise Program: Independently PT Short Term Goals Time to Complete Short Term Goals: 2 weeks PT Short Term Goal 1: Pt will present with normalized SI alignment in order to report pain less than a 3/10 for 50% of her day to her Rt hip region.  PT Short Term Goal 1 - Progress: Progressing toward goal PT Short Term Goal 2: Pt will present with minimal fascial restrictions to her Rt gluteal and ITB for decreased pain.  PT Short Term Goal 3: Pt will improve her core coordination and require min cueing with PT faciliation for TrA contraction to maintain SI alignment.  PT Short Term Goal 3 - Progress: Progressing toward goal PT Long Term Goals Time to Complete Long Term Goals: 4 weeks PT Long Term Goal 1: Pt will be independent with an adavanced HEP for core and lumbar stabalization and education on SI self coreection techniques.  PT Long Term Goal 2: Pt will improve present without pain to her Rt hip and report greater ease with zumba class. Long Term Goal 3: Pt will be independent with core exercises in order to maintain proper SI aliginment.   Problem List Patient Active Problem List   Diagnosis Date Noted  . 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 Radicular pain 03/27/2013  . Right hip pain 03/27/2013  .  Bursitis of right hip 03/27/2013  . Back pain 03/27/2013  . Unspecified hypothyroidism 03/12/2013  . History of colon cancer 03/12/2013    PT - End of Session Activity Tolerance: Patient tolerated treatment  well General Behavior During Therapy: [facility] for tasks assessed/performed  GP    Juel Burrow 04/17/2013, 5:08 PM

## 2013-04-19 ENCOUNTER — Ambulatory Visit (HOSPITAL_COMMUNITY)
Admission: RE | Admit: 2013-04-19 | Discharge: 2013-04-19 | Disposition: A | Discharge status: Home / Self Care | Payer: BC Managed Care – PPO | Source: Ambulatory Visit

## 2013-04-19 DIAGNOSIS — M25551 Pain in right hip: Secondary | ICD-10-CM

## 2013-04-19 DIAGNOSIS — M7071 Other bursitis of hip, right hip: Secondary | ICD-10-CM

## 2013-04-19 DIAGNOSIS — M549 Dorsalgia, unspecified: Secondary | ICD-10-CM

## 2013-04-19 NOTE — Progress Notes (Signed)
Physical Therapy Treatment Patient Details  Name: Jane Mclean MRN: [id number] Date of Birth: 1956-02-24  Today's Date: 04/19/2013 Time: 4098-1191 PT Time Calculation (min): 34 min Charge TE 4782-9562  Visit#: 5 of 8  Re-eval: 05/05/13 Assessment Diagnosis: Low back pain/Rt hip pain Next MD Visit: Dr. Romeo Apple 05/29/2013  Subjective: Symptoms/Limitations Symptoms: Pt reports pain free today.   Pain Assessment Currently in Pain?: No/denies  Precautions/Restrictions  Precautions Precautions: Other (comment) Precaution Comments: hx cancer  Exercise/Treatments Stretches Passive Hamstring Stretch: 2 reps;30 seconds;Limitations Passive Hamstring Stretch Limitations: with rope ITB Stretch: 2 reps;30 seconds Piriformis Stretch: 3 reps;30 seconds Standing Functional Squats: 15 reps;3 seconds Other Standing Lumbar Exercises: vector stance 5x 5" BLE Supine Bent Knee Raise: 10 reps;5 seconds;Limitations Bent Knee Raise Limitations: with ab set Bridge: 10 reps Bridge Limitations: on green ball 15 reps; h/s curl 10x Sidelying Clam: 5 reps;Limitations Clam Limitations: 10" holds Prone  Opposite Arm/Leg Raise: Right arm/Left leg;Left arm/Right leg;10 reps;5 seconds  Manual Therapy Other Manual Therapy: SI within alignment, no MET required this session.    Physical Therapy Assessment and Plan PT Assessment and Plan Clinical Impression Statement: SI within alignment, no MET required.  Pt reports return to Zumba without increased pain and increased ease with sleep.  Pt with increased ease with core stregthening/stability exercises today.  Added stretches to improve flexibiility and instructed to add to HEP.  Pt stated pain free at end of session.   PT Plan: No modalities, hx of cancer.  Continue with MET for SI correction and educate on proper core activation and exercises. Continue to progress towards functional goals for her to return to zumba. Continue with standing stability  exercises.      Goals Home Exercise Program Pt/caregiver will Perform Home Exercise Program: Independently PT Short Term Goals Time to Complete Short Term Goals: 2 weeks PT Short Term Goal 1: Pt will present with normalized SI alignment in order to report pain less than a 3/10 for 50% of her day to her Rt hip region.  PT Short Term Goal 1 - Progress: Progressing toward goal PT Short Term Goal 2: Pt will present with minimal fascial restrictions to her Rt gluteal and ITB for decreased pain.  PT Short Term Goal 3: Pt will improve her core coordination and require min cueing with PT faciliation for TrA contraction to maintain SI alignment.  PT Short Term Goal 3 - Progress: Progressing toward goal PT Long Term Goals Time to Complete Long Term Goals: 4 weeks PT Long Term Goal 1: Pt will be independent with an adavanced HEP for core and lumbar stabalization and education on SI self coreection techniques.  PT Long Term Goal 2: Pt will improve present without pain to her Rt hip and report greater ease with zumba class. PT Long Term Goal 2 - Progress: Progressing toward goal Long Term Goal 3: Pt will be independent with core exercises in order to maintain proper SI aliginment.  Long Term Goal 3 Progress: Progressing toward goal  Problem List Patient Active Problem List   Diagnosis Date Noted  . 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 Radicular pain 03/27/2013  . Right hip pain 03/27/2013  . Bursitis of right hip 03/27/2013  . Back pain 03/27/2013  . Unspecified hypothyroidism 03/12/2013  . History of colon cancer 03/12/2013    PT - End of Session Activity Tolerance: Patient tolerated treatment well General Behavior During Therapy: [facility] for tasks assessed/performed  GP    Juel Burrow 04/19/2013, 3:58 PM

## 2013-04-24 ENCOUNTER — Ambulatory Visit (HOSPITAL_COMMUNITY)
Admission: RE | Admit: 2013-04-24 | Discharge: 2013-04-24 | Disposition: A | Discharge status: Home / Self Care | Payer: BC Managed Care – PPO | Source: Ambulatory Visit | Attending: Family Medicine | Admitting: Family Medicine

## 2013-04-24 DIAGNOSIS — M7071 Other bursitis of hip, right hip: Secondary | ICD-10-CM

## 2013-04-24 DIAGNOSIS — M549 Dorsalgia, unspecified: Secondary | ICD-10-CM

## 2013-04-24 DIAGNOSIS — M25551 Pain in right hip: Secondary | ICD-10-CM

## 2013-04-24 NOTE — Progress Notes (Signed)
Physical Therapy Treatment Patient Details  Name: Jane Mclean MRN: [id number] Date of Birth: January 26, 1956  Today's Date: 04/24/2013 Time: 4696-2952 PT Time Calculation (min): 48 min Charge: Manual 1602-1610, 1635-1650;  TE 1610-1635  Visit#: 6 of 8  Re-eval: 05/05/13 Assessment Diagnosis: Low back pain/Rt hip pain Next MD Visit: Dr. Romeo Apple 05/29/2013  Subjective: Symptoms/Limitations Symptoms: Pt stated slight pain Rt hip 2/10 today.  Reported completeing a MET at home.   Pain Assessment Currently in Pain?: Yes Pain Score: 2  Pain Location: Hip Pain Orientation: Right  Precautions/Restrictions  Precautions Precautions: Other (comment) Precaution Comments: hx cancer  Exercise/Treatments Stretches Piriformis Stretch: 3 reps;30 seconds Aerobic Tread Mill: 5' total 2'30" forward at 3.0, backwards @1 .5 knees bent with incline 5 to set SI alignment. Standing Functional Squats: 15 reps;3 seconds;Limitations Functional Squats Limitations: proper lifting Other Standing Lumbar Exercises: Warrior pose I Bil 1x30", Warrior pose III 2x 10" Other Standing Lumbar Exercises: vector stance 5x 5" BLE   Modalities Modalities: Cryotherapy Manual Therapy Manual Therapy: Massage Massage: MFR f/b STM to Rt gluteal region in prone Other Manual Therapy: MET for slight Rt SI anterior rotation f/b gait on TM Cryotherapy Number Minutes Cryotherapy: 6 Minutes Cryotherapy Location:  (Rt hip in Lt S/L position) Type of Cryotherapy: Ice massage  Physical Therapy Assessment and Plan PT Assessment and Plan Clinical Impression Statement: MET for slight Rt SI anterior rotation with pain reduced and improved gait mechaincs following.  Progressed hip stabilty and balance with therex this session with min guard for LOB episodes.  Manual MFR to soft tissue massage to reduce fascial restrictions to Rt gluteal region, ended with ice massage to Rt hip region to reduce inflammation.  Pt reorted pain  free at end of session.   PT Plan: No modalities, hx of cancer.  Continue with MET for SI correction and educate on proper core activation and exercises. Continue to progress towards functional goals for her to return to zumba. Continue with standing stability exercises.      Goals Home Exercise Program Pt/caregiver will Perform Home Exercise Program: Independently PT Short Term Goals Time to Complete Short Term Goals: 2 weeks PT Short Term Goal 1: Pt will present with normalized SI alignment in order to report pain less than a 3/10 for 50% of her day to her Rt hip region.  PT Short Term Goal 1 - Progress: Progressing toward goal PT Short Term Goal 2: Pt will present with minimal fascial restrictions to her Rt gluteal and ITB for decreased pain.  PT Short Term Goal 2 - Progress: Progressing toward goal PT Short Term Goal 3: Pt will improve her core coordination and require min cueing with PT faciliation for TrA contraction to maintain SI alignment.  PT Short Term Goal 3 - Progress: Progressing toward goal PT Long Term Goals Time to Complete Long Term Goals: 4 weeks PT Long Term Goal 1: Pt will be independent with an adavanced HEP for core and lumbar stabalization and education on SI self coreection techniques.  PT Long Term Goal 2: Pt will improve present without pain to her Rt hip and report greater ease with zumba class. Long Term Goal 3: Pt will be independent with core exercises in order to maintain proper SI aliginment.   Problem List Patient Active Problem List   Diagnosis Date Noted  . 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 Radicular pain 03/27/2013  . Right hip pain 03/27/2013  . Bursitis of right hip 03/27/2013  . Back pain 03/27/2013  . Unspecified  hypothyroidism 03/12/2013  . History of colon cancer 03/12/2013    PT - End of Session Activity Tolerance: Patient tolerated treatment well General Behavior During Therapy: [facility] for tasks assessed/performed  GP    Juel Burrow 04/24/2013, 5:45 PM

## 2013-04-26 ENCOUNTER — Ambulatory Visit (HOSPITAL_COMMUNITY)
Admission: RE | Admit: 2013-04-26 | Discharge: 2013-04-26 | Disposition: A | Discharge status: Home / Self Care | Payer: BC Managed Care – PPO | Source: Ambulatory Visit | Attending: Orthopedic Surgery | Admitting: Orthopedic Surgery

## 2013-04-26 DIAGNOSIS — M549 Dorsalgia, unspecified: Secondary | ICD-10-CM

## 2013-04-26 DIAGNOSIS — M25551 Pain in right hip: Secondary | ICD-10-CM

## 2013-04-26 DIAGNOSIS — M7071 Other bursitis of hip, right hip: Secondary | ICD-10-CM

## 2013-04-26 NOTE — Progress Notes (Signed)
Physical Therapy Treatment Patient Details  Name: Jane Mclean MRN: [id number] Date of Birth: 22-Aug-1955  Today's Date: 04/26/2013 Time: 1525-1600 PT Time Calculation (min): 35 min Charges: Manual: 1525-1550 TE: 1550-1600 Visit#: 7 of 8  Re-eval: 05/05/13    Authorization:    Authorization Time Period:    Authorization Visit#:   of     Subjective: Symptoms/Limitations Symptoms: Pt reports that she is sleeping better, no buring pain down the leg.  She is no longer taking the neurtoin.  Feels she may have strained a muscle while doing her own MET.  Pain Assessment Currently in Pain?: Yes Pain Score: 6  Pain Location: Back Pain Orientation: Right Pain Type: Acute pain  Precautions/Restrictions     Exercise/Treatments Stretches Active Hamstring Stretch: 1 rep;60 seconds Quadruped Mid Back Stretch: 1 rep;60 seconds;Limitations Quadruped Mid Back Stretch Limitations: Lt side hold 60 seconds; Rt side hold 60 seconds ITB Stretch: 1 rep;60 seconds  Manual Therapy Manual Therapy: Myofascial release Joint Mobilization: Grade II-III to T112-T9 transverse process and Rt Ribs 9-12 to improve mobility  Massage: to Rt thoraic and gluteal region to decrease fascial restictions with soft tissue massage afterwards to decrease pain.   Physical Therapy Assessment and Plan PT Assessment and Plan Clinical Impression Statement: Pt did not require MET today.  manual therapy completed to Rt gluteal and thoracic region and had 0/10 pain after manual and stretching and pt able to ambulate after session without antalgic gait.  Encouraged to continue with cryotherapy today after her zumba class.  PT Plan: No modalities, hx of cancer.  Re-eval next visit    Goals Home Exercise Program Pt/caregiver will Perform Home Exercise Program: Independently PT Goal: Perform Home Exercise Program - Progress: Met PT Short Term Goals Time to Complete Short Term Goals: 2 weeks PT Short Term Goal 1: Pt will  present with normalized SI alignment in order to report pain less than a 3/10 for 50% of her day to her Rt hip region.  PT Short Term Goal 1 - Progress: Met PT Short Term Goal 2: Pt will present with minimal fascial restrictions to her Rt gluteal and ITB for decreased pain.  PT Short Term Goal 2 - Progress: Progressing toward goal PT Short Term Goal 3: Pt will improve her core coordination and require min cueing with PT faciliation for TrA contraction to maintain SI alignment.  PT Short Term Goal 3 - Progress: Met PT Long Term Goals Time to Complete Long Term Goals: 4 weeks PT Long Term Goal 1: Pt will be independent with an adavanced HEP for core and lumbar stabalization and education on SI self coreection techniques.  PT Long Term Goal 1 - Progress: Met PT Long Term Goal 2: Pt will improve present without pain to her Rt hip and report greater ease with zumba class. PT Long Term Goal 2 - Progress: Met Long Term Goal 3: Pt will be independent with core exercises in order to maintain proper SI aliginment.  Long Term Goal 3 Progress: Progressing toward goal  Problem List Patient Active Problem List   Diagnosis Date Noted  . 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 Radicular pain 03/27/2013  . Right hip pain 03/27/2013  . Bursitis of right hip 03/27/2013  . Back pain 03/27/2013  . Unspecified hypothyroidism 03/12/2013  . History of colon cancer 03/12/2013    PT - End of Session Activity Tolerance: Patient tolerated treatment well General Behavior During Therapy: [facility] for tasks assessed/performed PT Plan of Care PT Patient Instructions: continue  with ice.  Consulted and Agree with Plan of Care: Patient  GP    Areya Lemmerman 04/26/2013, 4:02 PM

## 2013-05-01 ENCOUNTER — Ambulatory Visit (HOSPITAL_COMMUNITY)
Admission: RE | Admit: 2013-05-01 | Discharge: 2013-05-01 | Disposition: A | Discharge status: Home / Self Care | Payer: BC Managed Care – PPO | Source: Ambulatory Visit | Attending: Orthopedic Surgery | Admitting: Orthopedic Surgery

## 2013-05-01 DIAGNOSIS — M549 Dorsalgia, unspecified: Secondary | ICD-10-CM

## 2013-05-01 DIAGNOSIS — M25551 Pain in right hip: Secondary | ICD-10-CM

## 2013-05-01 DIAGNOSIS — M7071 Other bursitis of hip, right hip: Secondary | ICD-10-CM

## 2013-05-01 NOTE — Evaluation (Signed)
Physical Therapy Re-Evaluation  Patient Details  Name: Jane Mclean MRN: [id number] Date of Birth: 1955/10/26  Today's Date: 05/01/2013 Time: 1526-1600 PT Time Calculation (min): 34 min Charges:  Manual: 30' MMT/ROM: 1             Visit#: 8 of 8  Re-eval: 05/05/13 Assessment Diagnosis: Low back pain/Rt hip pain Next MD Visit: Dr. Romeo Apple 05/29/2013  Subjective Symptoms/Limitations Symptoms: Pt reports she had a significant reduction in pain until yesterday and worse today.  She is not as sore as she was last time.  She is very sensitive to her Rt ITB and Rt piriformis region.  Pain Assessment Currently in Pain?: Yes Pain Score: 5  Pain Location: Back Pain Orientation: Right Pain Type: Acute pain  Precautions/Restrictions  Precautions Precautions: Other (comment) Precaution Comments: hx cancer  Sensation/Coordination/Flexibility/Functional Tests Coordination Coordination and Movement Description: normal to multifidus and impaired to transevese abdominous (TrA) with mod VC and visual cueing and tactile cueing for activaiton Flexibility Obers: Negative 90/90: Negative Functional Tests Functional Tests: - ASLR, - SLR Functional Tests: - Rt FABER  Assessment RLE Assessment RLE Assessment: Within Functional Limits LLE Assessment LLE Assessment: Within Functional Limits Palpation Palpation: min to moderate fascial restrictions to Rt quadratus lumborum and iliopsoas, min fascial restrictions with significant tenderness to Lt ITB,region.    Exercise/Treatments  Manual Therapy Manual Therapy: Myofascial release Joint Mobilization: Grade II-III to L3 SP Massage: MFR: to Rt lumbar, gluteal, ITB region to decrease fascial restictions with soft tissue massage afterwards to decrease pain. Educated pt on stick massage tool.   Physical Therapy Assessment and Plan PT Assessment and Plan Clinical Impression Statement: Jane Mclean has attended 8 OP PT visits for Rt hip pain  with following findings: has maintained and has been educated on self SI correction techniques, continues to be independent with core and LE exercises, maintains min-mod fascial restrictions to ITB, gluteal and Rt lumbar region which improves after manual therapy for approximaltly 4-5 days.  She continues to have moderate postural awareness difficulty and weight shifts to her Lt.  She is more aware of her compensation and is attmepting to correct.  Pain remains 4-5/10 on average to quadratus lumborum and psoas muscluature.   At this time educated pt on self massage tool to use to help decrease pain.   Pt will benefit from skilled therapeutic intervention in order to improve on the following deficits: Pain;Increased fascial restricitons;Improper spinal/pelvic alignment PT Frequency: Min 2X/week PT Duration:  (2 weeks) PT Treatment/Interventions: Therapeutic activities;Therapeutic exercise;Neuromuscular re-education;Manual techniques PT Plan: Continue with manual techniques as needed.  Add side lunges and forward lunges     Goals Home Exercise Program Pt/caregiver will Perform Home Exercise Program: Independently PT Goal: Perform Home Exercise Program - Progress: Met PT Short Term Goals Time to Complete Short Term Goals: 2 weeks PT Short Term Goal 1: Pt will present with normalized SI alignment in order to report pain less than a 3/10 for 50% of her day to her Rt hip region.  PT Short Term Goal 1 - Progress: Met PT Short Term Goal 2: Pt will present with minimal fascial restrictions to her Rt gluteal and ITB for decreased pain.  PT Short Term Goal 2 - Progress: Progressing toward goal PT Short Term Goal 3: Pt will improve her core coordination and require min cueing with PT faciliation for TrA contraction to maintain SI alignment.  PT Short Term Goal 3 - Progress: Met PT Long Term Goals Time to Complete Long Term Goals:  4 weeks PT Long Term Goal 1: Pt will be independent with an adavanced HEP for  core and lumbar stabalization and education on SI self coreection techniques.  PT Long Term Goal 1 - Progress: Met PT Long Term Goal 2: Pt will improve present without pain to her Rt hip and report greater ease with zumba class. PT Long Term Goal 2 - Progress: Met Long Term Goal 3: Pt will be independent with core exercises in order to maintain proper SI aliginment.  Long Term Goal 3 Progress: Met  Problem List Patient Active Problem List   Diagnosis Date Noted  . 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 Radicular pain 03/27/2013  . Right hip pain 03/27/2013  . Bursitis of right hip 03/27/2013  . Back pain 03/27/2013  . Unspecified hypothyroidism 03/12/2013  . History of colon cancer 03/12/2013    PT - End of Session Activity Tolerance: Patient tolerated treatment well General Behavior During Therapy: [facility] for tasks assessed/performed PT Plan of Care PT Patient Instructions: continue with ice.  Consulted and Agree with Plan of Care: Patient  GP    Annett Fabian, MPT, ATC 05/01/2013, 4:17 PM  Physician Documentation Your signature is required to indicate approval of the treatment plan as stated above.  Please sign and either send electronically or make a copy of this report for your files and return this physician signed original.   Please mark one 1.__approve of plan  2. ___approve of plan with the following conditions.   ______________________________                                                          _____________________ Physician Signature                                                                                                             Date

## 2013-05-07 ENCOUNTER — Inpatient Hospital Stay (HOSPITAL_COMMUNITY)
Admission: RE | Admit: 2013-05-07 | Discharge: 2013-05-07 | Disposition: A | Discharge status: Home / Self Care | Payer: BC Managed Care – PPO | Source: Ambulatory Visit | Attending: Physical Therapy | Admitting: Physical Therapy

## 2013-05-07 NOTE — Progress Notes (Addendum)
PT DISCHARGE NOTE  Patient Details  Name: Jane Mclean MRN: [id number] Date of Birth: [birth year] Charges: No charge Today's Date: 05/07/2013  Pt called at 9:30am to cx apts and did not receive a call back.  Came in to quickly state she wishes to be d/c.  Pt wishes to be d/c from PT secondary to financial reasons. Discussed and encouraged pt to continue with exercises and states she will have a massage therapist she will continue with. Will d/c from PT  Ramil Edgington, MPT, ATC 05/07/2013, 5:29 PM

## 2013-05-09 ENCOUNTER — Ambulatory Visit (HOSPITAL_COMMUNITY): Payer: BC Managed Care – PPO | Admitting: Physical Therapy

## 2013-05-14 ENCOUNTER — Encounter: Payer: Self-pay | Admitting: Orthopedic Surgery

## 2013-05-15 ENCOUNTER — Ambulatory Visit (HOSPITAL_COMMUNITY): Payer: BC Managed Care – PPO | Admitting: Physical Therapy

## 2013-05-18 ENCOUNTER — Ambulatory Visit (HOSPITAL_COMMUNITY): Payer: BC Managed Care – PPO

## 2013-05-21 ENCOUNTER — Other Ambulatory Visit: Payer: Self-pay | Admitting: Adult Health

## 2013-05-21 ENCOUNTER — Ambulatory Visit (INDEPENDENT_AMBULATORY_CARE_PROVIDER_SITE_OTHER): Payer: BC Managed Care – PPO | Admitting: Adult Health

## 2013-05-21 ENCOUNTER — Encounter: Payer: Self-pay | Admitting: Adult Health

## 2013-05-21 VITALS — BP 128/80 | HR 78 | Ht 63.0 in | Wt 161.0 lb

## 2013-05-21 DIAGNOSIS — Z85038 Personal history of other malignant neoplasm of large intestine: Secondary | ICD-10-CM

## 2013-05-21 DIAGNOSIS — Z79890 Hormone replacement therapy (postmenopausal): Secondary | ICD-10-CM

## 2013-05-21 DIAGNOSIS — E039 Hypothyroidism, unspecified: Secondary | ICD-10-CM | POA: Insufficient documentation

## 2013-05-21 DIAGNOSIS — Z01419 Encounter for gynecological examination (general) (routine) without abnormal findings: Secondary | ICD-10-CM

## 2013-05-21 HISTORY — DX: Hormone replacement therapy: Z79.890

## 2013-05-21 MED ORDER — NYSTATIN-TRIAMCINOLONE 100000-0.1 UNIT/GM-% EX OINT: TOPICAL_OINTMENT | Freq: Two times a day (BID) | CUTANEOUS | Status: DC

## 2013-05-21 NOTE — Patient Instructions (Signed)
Physical in 1 year Mammogram yearly Labs with PCP Colonoscopy due 2015

## 2013-05-21 NOTE — Progress Notes (Signed)
Patient ID: Jane Mclean, female   DOB: 08/26/1955, 57 y.o.   MRN: [id number] History of Present Illness: Lizmary is a 57 year old white female married in for physical.Had normal pap with negative HPV 05/18/13.Got flu shot at work.   Current Medications, Allergies, Past Medical History, Past Surgical History, Family History and Social History were reviewed in [employer] record.     Review of Systems: patient denies any headaches, blurred vision, shortness of breath, chest pain, abdominal pain, problems with bowel movements, urination, or intercourse. No joint swelling or mood changes. Happy with HRT.   Physical Exam:BP 128/80  Pulse 78  Ht 5\' 3"  (1.6 m)  Wt 161 lb (73.029 kg)  BMI 28.53 kg/m2 General:  Well developed, well nourished, no acute distress Skin:  Warm and dry Neck:  Midline trachea, normal thyroid Lungs; Clear to auscultation bilaterally Breast:  No dominant palpable mass, retraction, or nipple discharge Cardiovascular: Regular rate and rhythm Abdomen:  Soft, non tender, no hepatosplenomegaly Pelvic:  External genitalia is normal in appearance.  The vagina is normal in appearance.  The cervix is smooth.  Uterus is felt to be normal size, shape, and contour.  No adnexal masses or tenderness noted. Rectal: Good sphincter tone, no polyps, or hemorrhoids felt.  Hemoccult negative. Extremities:  No swelling noted, has some spider veins Psych:  No mood changes, alert and cooperative seems happy   Impression: Yearly exam no pap HRT Hypothyroid History of colon cancer    Plan: Physical in 1 year Mammogram yearly Colonoscopy 2015 Labs with PCP Refilled mytrex cream Refilled vivelle dot 0.05 mg and prometrium 100 mg

## 2013-05-22 ENCOUNTER — Ambulatory Visit (HOSPITAL_COMMUNITY): Payer: BC Managed Care – PPO | Admitting: Physical Therapy

## 2013-05-28 ENCOUNTER — Other Ambulatory Visit: Payer: Self-pay | Admitting: Adult Health

## 2013-05-28 DIAGNOSIS — Z139 Encounter for screening, unspecified: Secondary | ICD-10-CM

## 2013-05-29 ENCOUNTER — Ambulatory Visit: Payer: BC Managed Care – PPO | Admitting: Orthopedic Surgery

## 2013-06-05 ENCOUNTER — Ambulatory Visit (INDEPENDENT_AMBULATORY_CARE_PROVIDER_SITE_OTHER): Payer: BC Managed Care – PPO | Admitting: Orthopedic Surgery

## 2013-06-05 VITALS — BP 146/81 | Ht 63.0 in | Wt 160.0 lb

## 2013-06-05 DIAGNOSIS — M67919 Unspecified disorder of synovium and tendon, unspecified shoulder: Secondary | ICD-10-CM

## 2013-06-05 DIAGNOSIS — M549 Dorsalgia, unspecified: Secondary | ICD-10-CM

## 2013-06-05 DIAGNOSIS — M75101 Unspecified rotator cuff tear or rupture of right shoulder, not specified as traumatic: Secondary | ICD-10-CM

## 2013-06-05 NOTE — Patient Instructions (Signed)
You have received a steroid shot. 15% of patients experience increased pain at the injection site with in the next 24 hours. This is best treated with ice and tylenol extra strength 2 tabs every 8 hours. If you are still having pain please call the office.   Impingement Syndrome, Rotator Cuff, Bursitis with Rehab Impingement syndrome is a condition that involves inflammation of the tendons of the rotator cuff and the subacromial bursa, that causes pain in the shoulder. The rotator cuff consists of four tendons and muscles that control much of the shoulder and upper arm function. The subacromial bursa is a fluid filled sac that helps reduce friction between the rotator cuff and one of the bones of the shoulder (acromion). Impingement syndrome is usually an overuse injury that causes swelling of the bursa (bursitis), swelling of the tendon (tendonitis), and/or a tear of the tendon (strain). Strains are classified into three categories. Grade 1 strains cause pain, but the tendon is not lengthened. Grade 2 strains include a lengthened ligament, due to the ligament being stretched or partially ruptured. With grade 2 strains there is still function, although the function may be decreased. Grade 3 strains include a complete tear of the tendon or muscle, and function is usually impaired. SYMPTOMS   Pain around the shoulder, often at the outer portion of the upper arm.  Pain that gets worse with shoulder function, especially when reaching overhead or lifting.  Sometimes, aching when not using the arm.  Pain that wakes you up at night.  Sometimes, tenderness, swelling, warmth, or redness over the affected area.  Loss of strength.  Limited motion of the shoulder, especially reaching behind the back (to the back pocket or to unhook bra) or across your body.  Crackling sound (crepitation) when moving the arm.  Biceps tendon pain and inflammation (in the front of the shoulder). Worse when bending the elbow  or lifting. CAUSES  Impingement syndrome is often an overuse injury, in which chronic (repetitive) motions cause the tendons or bursa to become inflamed. A strain occurs when a force is paced on the tendon or muscle that is greater than it can withstand. Common mechanisms of injury include: Stress from sudden increase in duration, frequency, or intensity of training.  Direct hit (trauma) to the shoulder.  Aging, erosion of the tendon with normal use.  Bony bump on shoulder (acromial spur). RISK INCREASES WITH:  Contact sports (football, wrestling, boxing).  Throwing sports (baseball, tennis, volleyball).  Weightlifting and bodybuilding.  Heavy labor.  Previous injury to the rotator cuff, including impingement.  Poor shoulder strength and flexibility.  Failure to warm up properly before activity.  Inadequate protective equipment.  Old age.  Bony bump on shoulder (acromial spur). PREVENTION   Warm up and stretch properly before activity.  Allow for adequate recovery between workouts.  Maintain physical fitness:  Strength, flexibility, and endurance.  Cardiovascular fitness.  Learn and use proper exercise technique. PROGNOSIS  If treated properly, impingement syndrome usually goes away within 6 weeks. Sometimes surgery is required.  RELATED COMPLICATIONS   Longer healing time if not properly treated, or if not given enough time to heal.  Recurring symptoms, that result in a chronic condition.  Shoulder stiffness, frozen shoulder, or loss of motion.  Rotator cuff tendon tear.  Recurring symptoms, especially if activity is resumed too soon, with overuse, with a direct blow, or when using poor technique. TREATMENT  Treatment first involves the use of ice and medicine, to reduce pain and inflammation.   The use of strengthening and stretching exercises may help reduce pain with activity. These exercises may be performed at home or with a therapist. If non-surgical  treatment is unsuccessful after more than 6 months, surgery may be advised. After surgery and rehabilitation, activity is usually possible in 3 months.  MEDICATION  If pain medicine is needed, nonsteroidal anti-inflammatory medicines (aspirin and ibuprofen), or other minor pain relievers (acetaminophen), are often advised.  Do not take pain medicine for 7 days before surgery.  Prescription pain relievers may be given, if your caregiver thinks they are needed. Use only as directed and only as much as you need.  Corticosteroid injections may be given by your caregiver. These injections should be reserved for the most serious cases, because they may only be given a certain number of times. HEAT AND COLD  Cold treatment (icing) should be applied for 10 to 15 minutes every 2 to 3 hours for inflammation and pain, and immediately after activity that aggravates your symptoms. Use ice packs or an ice massage.  Heat treatment may be used before performing stretching and strengthening activities prescribed by your caregiver, physical therapist, or athletic trainer. Use a heat pack or a warm water soak. SEEK MEDICAL CARE IF:   Symptoms get worse or do not improve in 4 to 6 weeks, despite treatment.  New, unexplained symptoms develop. (Drugs used in treatment may produce side effects.)   Do shoulder exercises for 3 weeks if no improvement call office to get x-rays done

## 2013-06-06 ENCOUNTER — Encounter: Payer: Self-pay | Admitting: Orthopedic Surgery

## 2013-06-06 DIAGNOSIS — M75101 Unspecified rotator cuff tear or rupture of right shoulder, not specified as traumatic: Secondary | ICD-10-CM | POA: Insufficient documentation

## 2013-06-06 NOTE — Progress Notes (Signed)
Patient ID: Jane Mclean, female   DOB: 27-Nov-1955, 57 y.o.   MRN: [id number]  Chief Complaint  Patient presents with  . Back Pain    Followup post therapy  . Shoulder Pain    Pain right shoulder after flu vaccine injection    Back pain status post physical therapy hip leg and back symptoms resolved  New complaint right shoulder pain after injection 4 flu vaccine  The patient noticed immediate numbness tingling in severe pain in the right shoulder after her flu vaccine several weeks back. She now has painful range of motion pain with forward elevation mild weakness and pain radiating into the upper arm  Review of systems she denies any neck symptoms skin changes persistent numbness and tingling related to the shoulder  BP 146/81  Ht 5\' 3"  (1.6 m)  Wt 160 lb (72.576 kg)  BMI 28.35 kg/m2 General appearance is normal, the patient is alert and oriented x3 with normal mood and affect. Ambulation is not an issue it is normal  Her right shoulder is tender in the posterior joint line is painful range of motion chest normal passive range of motion except for pain her shoulder is stable she has mild weakness in the rotator cuff or scans intact there is no rash is no tenderness directly over the injection site pulse and sensation are normal she has no positive for palpable lymph nodes  Impression rotator cuff syndrome  Back pain resolved  Recommend subacromial injection and physical therapy at home  Shoulder Injection Procedure Note   Pre-operative Diagnosis: right  RC Syndrome  Post-operative Diagnosis: same  Indications: pain   Anesthesia: ethyl chloride   Procedure Details   Verbal consent was obtained for the procedure. The shoulder was prepped withalcohol and the skin was anesthetized. A 20 gauge needle was advanced into the subacromial space through posterior approach without difficulty  The space was then injected with 3 ml 1% lidocaine and 1 ml of depomedrol. The injection  site was cleansed with isopropyl alcohol and a dressing was applied.  Complications:  None; patient tolerated the procedure well.

## 2013-06-07 ENCOUNTER — Other Ambulatory Visit: Payer: Self-pay

## 2013-06-19 ENCOUNTER — Ambulatory Visit (HOSPITAL_COMMUNITY)
Admission: RE | Admit: 2013-06-19 | Discharge: 2013-06-19 | Disposition: A | Discharge status: Home / Self Care | Payer: BC Managed Care – PPO | Source: Ambulatory Visit | Attending: Adult Health | Admitting: Adult Health

## 2013-06-19 DIAGNOSIS — Z1231 Encounter for screening mammogram for malignant neoplasm of breast: Secondary | ICD-10-CM | POA: Insufficient documentation

## 2013-06-19 DIAGNOSIS — Z139 Encounter for screening, unspecified: Secondary | ICD-10-CM

## 2013-08-13 ENCOUNTER — Encounter (INDEPENDENT_AMBULATORY_CARE_PROVIDER_SITE_OTHER): Payer: Self-pay | Admitting: *Deleted

## 2013-08-13 ENCOUNTER — Other Ambulatory Visit (INDEPENDENT_AMBULATORY_CARE_PROVIDER_SITE_OTHER): Payer: Self-pay | Admitting: *Deleted

## 2013-08-13 DIAGNOSIS — Z85038 Personal history of other malignant neoplasm of large intestine: Secondary | ICD-10-CM

## 2013-08-14 ENCOUNTER — Telehealth (INDEPENDENT_AMBULATORY_CARE_PROVIDER_SITE_OTHER): Payer: Self-pay | Admitting: *Deleted

## 2013-08-14 DIAGNOSIS — Z1211 Encounter for screening for malignant neoplasm of colon: Secondary | ICD-10-CM

## 2013-08-14 MED ORDER — PEG-KCL-NACL-NASULF-NA ASC-C 100 G PO SOLR
1.0000 | Freq: Once | ORAL | Status: DC
Start: 2013-08-14 — End: 2014-02-06

## 2013-08-14 NOTE — Telephone Encounter (Signed)
Patient needs movi prep 

## 2013-08-30 ENCOUNTER — Telehealth: Payer: Self-pay | Admitting: Family Medicine

## 2013-08-30 NOTE — Telephone Encounter (Signed)
Patient requesting referral to [facility] for her colonoscopy due to the reduced cost out of pocket, if "ok" I will change referral in system to [facility] (No need for new referral).

## 2013-08-30 NOTE — Telephone Encounter (Signed)
Out of interest which insur co does this pt have, yes may do

## 2013-08-30 NOTE — Telephone Encounter (Signed)
[employer]

## 2013-08-30 NOTE — Telephone Encounter (Signed)
k

## 2013-09-12 ENCOUNTER — Telehealth (INDEPENDENT_AMBULATORY_CARE_PROVIDER_SITE_OTHER): Payer: Self-pay | Admitting: *Deleted

## 2013-09-12 NOTE — Telephone Encounter (Signed)
  Procedure: tcs  Reason/Indication:  Hx colon ca  Has patient had this procedure before?  Yes, 3 years ago in [place]  If so, when, by whom and where?    Is there a family history of colon cancer?  no  Who?  What age when diagnosed?    Is patient diabetic?   no      Does patient have prosthetic heart valve?  no  Do you have a pacemaker?  no  Has patient ever had endocarditis? no  Has patient had joint replacement within last 12 months?  no  Does patient tend to be constipated or take laxatives? no  Is patient on Coumadin, Plavix and/or Aspirin? no  Medications: synthroid 100 mcg daily, vivelle-dot patch, prometrium every 3 months  Allergies: see EPIC  Medication Adjustment:   Procedure date & time: 10/11/13 at 830

## 2013-09-13 NOTE — Telephone Encounter (Signed)
agree

## 2013-09-17 ENCOUNTER — Encounter: Payer: Self-pay | Admitting: Family Medicine

## 2013-10-11 ENCOUNTER — Ambulatory Visit (HOSPITAL_COMMUNITY)
Admission: RE | Admit: 2013-10-11 | Payer: BC Managed Care – PPO | Source: Ambulatory Visit | Admitting: Internal Medicine

## 2013-10-11 ENCOUNTER — Encounter (HOSPITAL_COMMUNITY): Admission: RE | Payer: Self-pay | Source: Ambulatory Visit

## 2013-10-11 SURGERY — COLONOSCOPY
Anesthesia: Moderate Sedation

## 2014-02-06 ENCOUNTER — Ambulatory Visit (INDEPENDENT_AMBULATORY_CARE_PROVIDER_SITE_OTHER): Payer: BC Managed Care – PPO | Admitting: Family Medicine

## 2014-02-06 ENCOUNTER — Encounter: Payer: Self-pay | Admitting: Family Medicine

## 2014-02-06 VITALS — BP 128/84 | Temp 98.0°F | Ht 63.0 in | Wt 159.0 lb

## 2014-02-06 DIAGNOSIS — E039 Hypothyroidism, unspecified: Secondary | ICD-10-CM

## 2014-02-06 DIAGNOSIS — I889 Nonspecific lymphadenitis, unspecified: Secondary | ICD-10-CM

## 2014-02-06 MED ORDER — LEVOTHYROXINE SODIUM 100 MCG PO TABS: 100.0000 ug | ORAL_TABLET | Freq: Every day | ORAL | Status: DC

## 2014-02-06 MED ORDER — CEFDINIR 300 MG PO CAPS: 300.0000 mg | ORAL_CAPSULE | Freq: Two times a day (BID) | ORAL | Status: DC

## 2014-02-06 NOTE — Progress Notes (Signed)
   Subjective:    Patient ID: Jane Mclean, female    DOB: 05-22-1956, 58 y.o.   MRN: [id number]  Sore Throat  This is a new problem. The current episode started in the past 7 days. Maximum temperature: low grade. Associated symptoms include swollen glands. Associated symptoms comments: Sinus drainage.   Started ten days ago.  Felt bad at the start  Low gr fever  No high fevers  No rash  Usually not allergies  No sickness  Tight tender and sore  Tried no meds  nyquil at night,, no cmoke in environ   Also patient compliant with thyroid medicines. No symptoms of high or low thyroid. Review of Systems No fever no chills no diarrhea ROS otherwise negative    Objective:   Physical Exam   Alert moderate malaise. Nasal discharge TMs normal. Pharynx erythematous tender anterior nodes neck bilateral lungs clear. Heart rare in rhythm.     Assessment & Plan:  Impression cervical lymphadenitis #2 hypothyroidism plan antibiotics prescribed. Symptomatic care discussed. Check TSH if in good control will maintain for one year. WSL

## 2014-05-01 LAB — TSH: TSH: 3.133 u[IU]/mL (ref 0.350–4.500)

## 2014-05-06 MED ORDER — LEVOTHYROXINE SODIUM 100 MCG PO TABS: 100.0000 ug | ORAL_TABLET | Freq: Every day | ORAL | Status: DC

## 2014-05-06 NOTE — Addendum Note (Signed)
Addended by: Dairl Ponder on: 05/06/2014 10:38 AM   Modules accepted: Orders

## 2014-05-22 ENCOUNTER — Ambulatory Visit (INDEPENDENT_AMBULATORY_CARE_PROVIDER_SITE_OTHER): Payer: BC Managed Care – PPO | Admitting: Adult Health

## 2014-05-22 ENCOUNTER — Encounter: Payer: Self-pay | Admitting: Adult Health

## 2014-05-22 VITALS — BP 136/80 | HR 76 | Ht 63.25 in | Wt 161.0 lb

## 2014-05-22 DIAGNOSIS — Z79890 Hormone replacement therapy (postmenopausal): Secondary | ICD-10-CM

## 2014-05-22 DIAGNOSIS — Z01419 Encounter for gynecological examination (general) (routine) without abnormal findings: Secondary | ICD-10-CM

## 2014-05-22 DIAGNOSIS — Z1212 Encounter for screening for malignant neoplasm of rectum: Secondary | ICD-10-CM

## 2014-05-22 LAB — HEMOCCULT GUIAC POC 1CARD (OFFICE): Fecal Occult Blood, POC: NEGATIVE

## 2014-05-22 MED ORDER — PROGESTERONE MICRONIZED 100 MG PO CAPS
ORAL_CAPSULE | ORAL | Status: DC
Start: 2014-05-22 — End: 2015-05-29

## 2014-05-22 MED ORDER — NYSTATIN-TRIAMCINOLONE 100000-0.1 UNIT/GM-% EX OINT: TOPICAL_OINTMENT | Freq: Two times a day (BID) | CUTANEOUS | Status: DC

## 2014-05-22 NOTE — Progress Notes (Signed)
Patient ID: Jane Mclean, female   DOB: 08/17/1955, 58 y.o.   MRN: [id number] History of Present Illness: Jane Mclean is a 58 year old white female in for a gyn physical, she had a normal pap with negative HPV,05/18/12.No complaints.She had her colonoscopy in May and is good for 3 years.   Current Medications, Allergies, Past Medical History, Past Surgical History, Family History and Social History were reviewed in [employer] record.     Review of Systems:Patient denies any headaches, blurred vision, shortness of breath, chest pain, abdominal pain, problems with bowel movements, urination, or intercourse.No joint pain or mood swings, she got her flu shot at work 05/07/14.     Physical Exam:BP 136/80  Pulse 76  Ht 5' 3.25" (1.607 m)  Wt 161 lb (73.029 kg)  BMI 28.28 kg/m2 General:  Well developed, well nourished, no acute distress Skin:  Warm and dry Neck:  Midline trachea, normal thyroid Lungs; Clear to auscultation bilaterally Breast:  No dominant palpable mass, retraction, or nipple discharge Cardiovascular: Regular rate and rhythm Abdomen:  Soft, non tender, no hepatosplenomegaly Pelvic:  External genitalia is normal in appearance.  The vagina has decrease color and rugae, moisture good. The cervix is bulbous and smooth.  Uterus is felt to be normal size, shape, and contour.  No  adnexal masses or tenderness noted. Rectal: Good sphincter tone, no polyps, or hemorrhoids felt.  Hemoccult negative. Extremities:  No swelling or varicosities noted Psych:  No mood changes,alert and cooperative,seems happy, will stay on HRT for now, may try to stop around 62.   Impression: Well woman yearly exam no pap HRT    Plan: Refilled Vivelle dot 0.05 mg #24 use 1 patch 2 x weekly with prn refills hard copy given, she orders them.refilled Prometrium 100 mg #12 1 daily x 12 days every 3 months with 4 refills Refilled mycolog cream Pap and physical in 1 year Mammogram  yearly Colonoscopy 2018 Labs with PCP

## 2014-05-22 NOTE — Patient Instructions (Signed)
Pap and physical in 1 year Mammogram yearly Labs with PCP  Colonoscopy 2018

## 2014-05-28 ENCOUNTER — Other Ambulatory Visit: Payer: Self-pay | Admitting: Adult Health

## 2014-05-28 DIAGNOSIS — Z1231 Encounter for screening mammogram for malignant neoplasm of breast: Secondary | ICD-10-CM

## 2014-06-03 ENCOUNTER — Encounter: Payer: Self-pay | Admitting: Adult Health

## 2014-07-01 ENCOUNTER — Ambulatory Visit (HOSPITAL_COMMUNITY)
Admission: RE | Admit: 2014-07-01 | Discharge: 2014-07-01 | Disposition: A | Discharge status: Home / Self Care | Payer: BC Managed Care – PPO | Source: Ambulatory Visit | Attending: Adult Health | Admitting: Adult Health

## 2014-07-01 DIAGNOSIS — Z1231 Encounter for screening mammogram for malignant neoplasm of breast: Secondary | ICD-10-CM | POA: Insufficient documentation

## 2014-08-20 ENCOUNTER — Encounter: Payer: Self-pay | Admitting: Family Medicine

## 2014-08-20 ENCOUNTER — Ambulatory Visit (INDEPENDENT_AMBULATORY_CARE_PROVIDER_SITE_OTHER): Payer: BC Managed Care – PPO | Admitting: Family Medicine

## 2014-08-20 VITALS — BP 140/80 | Temp 98.3°F | Ht 63.0 in | Wt 165.2 lb

## 2014-08-20 DIAGNOSIS — J019 Acute sinusitis, unspecified: Secondary | ICD-10-CM

## 2014-08-20 MED ORDER — CEFDINIR 300 MG PO CAPS: 300.0000 mg | ORAL_CAPSULE | Freq: Two times a day (BID) | ORAL | Status: DC

## 2014-08-20 NOTE — Patient Instructions (Addendum)
TAKE CLARITIN (LORATADINE) 10MG  ONE EVERY EVENING

## 2014-08-20 NOTE — Progress Notes (Signed)
   Subjective:    Patient ID: Jane Mclean, female    DOB: October 04, 1955, 59 y.o.   MRN: [id number]  Sore Throat  This is a new problem. The current episode started more than 1 month ago. The problem has been unchanged. Neither side of throat is experiencing more pain than the other. The pain is moderate. She has tried nothing for the symptoms. The treatment provided no relief.   Patient states that she thinks this is related to her thyroid (possible thyroiditis) and her voice vocal range has changed. She does not believe it is on the level of strep throat or anything like that.   Started in oct in the fall, voice gravelly and had trouble singing  Wondered about allergies  Voice gravelly and crackles   No major clearing of throat but notes drainage and pressure  Hx of possible thyroiditis,       Review of Systems No vomiting no diarrhea no rash no chest pain no significant cough    Objective:   Physical Exam Alert no acute distress HEENT slight nasal congestion pharynx normal neck supple TMs normal.       Assessment & Plan:  Impression possible subacute sinusitis with element of perennial rhinitis and now secondary voice, laryngeal symptoms. Highly doubt recurrence of thyroiditis. Thyroid pretty much burned-out at this point plan trial of Omnicef. Claritin daily at bedtime. WSL

## 2014-09-20 ENCOUNTER — Telehealth: Payer: Self-pay | Admitting: Family Medicine

## 2014-09-20 DIAGNOSIS — J029 Acute pharyngitis, unspecified: Secondary | ICD-10-CM

## 2014-09-20 NOTE — Telephone Encounter (Signed)
Pt seen on 01/19 for sore throat. Pt states she is no better or worst. She feels exactly the same.   Assessment: Impression possible subacute sinusitis with element of perennial rhinitis and now secondary voice, laryngeal symptoms. Highly doubt recurrence of thyroiditis. Thyroid pretty much burned-out at this point plan trial of Omnicef. Claritin daily at bedtime.

## 2014-09-20 NOTE — Telephone Encounter (Signed)
Pt called stating that she was seen a month ago and has been  Taking the claritin and antibiotics and her symptoms have not changed. Pt wants to know what the next step is.

## 2014-09-20 NOTE — Telephone Encounter (Signed)
With no better and no worse rather not pile on more meds. rec ENT ref, cont sympto care only [place] ref

## 2014-09-20 NOTE — Telephone Encounter (Signed)
Patient notified and verbalized understanding.   Referral placed.   

## 2014-10-24 ENCOUNTER — Ambulatory Visit (INDEPENDENT_AMBULATORY_CARE_PROVIDER_SITE_OTHER): Payer: BC Managed Care – PPO | Admitting: Otolaryngology

## 2014-10-24 DIAGNOSIS — H6983 Other specified disorders of Eustachian tube, bilateral: Secondary | ICD-10-CM

## 2014-10-24 DIAGNOSIS — R490 Dysphonia: Secondary | ICD-10-CM | POA: Diagnosis not present

## 2014-10-24 DIAGNOSIS — K219 Gastro-esophageal reflux disease without esophagitis: Secondary | ICD-10-CM | POA: Diagnosis not present

## 2014-11-28 ENCOUNTER — Ambulatory Visit (INDEPENDENT_AMBULATORY_CARE_PROVIDER_SITE_OTHER): Payer: BC Managed Care – PPO | Admitting: Otolaryngology

## 2014-11-28 DIAGNOSIS — R490 Dysphonia: Secondary | ICD-10-CM | POA: Diagnosis not present

## 2015-02-05 ENCOUNTER — Telehealth: Payer: Self-pay | Admitting: Family Medicine

## 2015-02-05 NOTE — Telephone Encounter (Signed)
Pt is wanting to speak with a nurse regarding immunizations that she needs before going out of town. Pt needs to know if she can get those with [place] or if she needs to get them at the health dept.

## 2015-02-11 ENCOUNTER — Ambulatory Visit (INDEPENDENT_AMBULATORY_CARE_PROVIDER_SITE_OTHER): Payer: BC Managed Care – PPO | Admitting: Family Medicine

## 2015-02-11 ENCOUNTER — Encounter: Payer: Self-pay | Admitting: Family Medicine

## 2015-02-11 VITALS — BP 130/86 | Ht 63.0 in | Wt 161.0 lb

## 2015-02-11 DIAGNOSIS — E039 Hypothyroidism, unspecified: Secondary | ICD-10-CM | POA: Diagnosis not present

## 2015-02-11 DIAGNOSIS — J309 Allergic rhinitis, unspecified: Secondary | ICD-10-CM | POA: Diagnosis not present

## 2015-02-11 DIAGNOSIS — J3489 Other specified disorders of nose and nasal sinuses: Secondary | ICD-10-CM

## 2015-02-11 MED ORDER — DOXYCYCLINE HYCLATE 100 MG PO TABS: ORAL_TABLET | ORAL | Status: DC

## 2015-02-11 NOTE — Progress Notes (Signed)
   Subjective:    Patient ID: Michon Kaczmarek, female    DOB: 02-Sep-1955, 59 y.o.   MRN: [id number] Patient arrives office with several concerns. HPI Patient is here today for follow up visit on hypothyroidism. Compliant with thyroid medication. No obvious side effects. Has not missed a dose. No symptoms of high or low thyroid.   Patient is going out of the country soon and needs to discuss vaccinations she may need for this trip.   Aug 14 thru 25th. Patient going to [place]. Will be mostly an urban setting but some world. Wonders what shot she should take.  [place]  [place]   Patient also wants to discuss a possible referral for allergy testing. Ongoing challenges with hoarse voice. Saw ENT. Then 1 onto see a vocal cord specialist and now some vocal cord training. Patient is pretty much convinced that allergies are a substantial component of her misery does report chronic drainage and congestion with vocal affect   Review of Systems No headache no fever no chills no abdominal pain no chest pain    Objective:   Physical Exam  Alert vitals stable HET Mondays congestion frontal neck supple. Lungs thyroid nonpalpable lungs clear. 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 #3 travel medicine I spent nearly 10 minutes in patient's presence researching. She should get a hepatitis A series with immunoglobulin augmentation. She should get typhoid and also malaria prophylaxis with chloroquine resistance plan doxycycline prescribed appropriate dose. Appropriate blood work. Allergy referral. WSL

## 2015-02-11 NOTE — Patient Instructions (Signed)
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

## 2015-02-14 ENCOUNTER — Encounter: Payer: Self-pay | Admitting: Family Medicine

## 2015-03-10 ENCOUNTER — Telehealth: Payer: Self-pay | Admitting: Family Medicine

## 2015-03-10 ENCOUNTER — Other Ambulatory Visit: Payer: Self-pay | Admitting: *Deleted

## 2015-03-10 MED ORDER — LEVOTHYROXINE SODIUM 100 MCG PO TABS: 100.0000 ug | ORAL_TABLET | Freq: Every day | ORAL | Status: DC

## 2015-03-10 NOTE — Telephone Encounter (Signed)
Pt is needing a refill on her synthroid. Pt will not be able to get her blood work done till after 9/15 and will be out by then. Pt is requesting a paper script and 90 day supply

## 2015-03-10 NOTE — Telephone Encounter (Signed)
Med sent to pharm. Pt notified on voicemail.  

## 2015-04-30 LAB — T4: T4, Total: 9.3 ug/dL (ref 4.5–12.0)

## 2015-04-30 LAB — TSH: TSH: 2.86 u[IU]/mL (ref 0.450–4.500)

## 2015-05-07 ENCOUNTER — Other Ambulatory Visit: Payer: Self-pay | Admitting: *Deleted

## 2015-05-07 MED ORDER — LEVOTHYROXINE SODIUM 100 MCG PO TABS: 100.0000 ug | ORAL_TABLET | Freq: Every day | ORAL | Status: DC

## 2015-05-28 ENCOUNTER — Encounter: Payer: Self-pay | Admitting: Adult Health

## 2015-05-28 ENCOUNTER — Other Ambulatory Visit: Payer: BC Managed Care – PPO | Admitting: Adult Health

## 2015-05-29 ENCOUNTER — Telehealth: Payer: Self-pay | Admitting: Adult Health

## 2015-05-29 MED ORDER — PROGESTERONE MICRONIZED 100 MG PO CAPS: ORAL_CAPSULE | ORAL | Status: DC

## 2015-05-29 NOTE — Telephone Encounter (Signed)
Left message x 1. JSY 

## 2015-05-29 NOTE — Telephone Encounter (Signed)
Will refill prometrium

## 2015-05-29 NOTE — Telephone Encounter (Signed)
Spoke with pt. She is needing a refill on Prometrium. She has an appt to see you 06/10/15. Thanks!! Castor

## 2015-06-10 ENCOUNTER — Encounter: Payer: Self-pay | Admitting: Adult Health

## 2015-06-10 ENCOUNTER — Other Ambulatory Visit (HOSPITAL_COMMUNITY)
Admission: RE | Admit: 2015-06-10 | Discharge: 2015-06-10 | Disposition: A | Discharge status: Home / Self Care | Payer: BC Managed Care – PPO | Source: Ambulatory Visit | Attending: Adult Health | Admitting: Adult Health

## 2015-06-10 ENCOUNTER — Ambulatory Visit (INDEPENDENT_AMBULATORY_CARE_PROVIDER_SITE_OTHER): Payer: BC Managed Care – PPO | Admitting: Adult Health

## 2015-06-10 ENCOUNTER — Encounter: Payer: Self-pay | Admitting: Allergy and Immunology

## 2015-06-10 ENCOUNTER — Ambulatory Visit (INDEPENDENT_AMBULATORY_CARE_PROVIDER_SITE_OTHER): Payer: BC Managed Care – PPO | Admitting: Allergy and Immunology

## 2015-06-10 VITALS — BP 154/90 | HR 68 | Ht 63.25 in | Wt 163.5 lb

## 2015-06-10 VITALS — BP 132/80 | HR 73 | Temp 98.0°F | Resp 20

## 2015-06-10 DIAGNOSIS — H1010 Acute atopic conjunctivitis, unspecified eye: Secondary | ICD-10-CM | POA: Diagnosis not present

## 2015-06-10 DIAGNOSIS — J309 Allergic rhinitis, unspecified: Secondary | ICD-10-CM

## 2015-06-10 DIAGNOSIS — Z01419 Encounter for gynecological examination (general) (routine) without abnormal findings: Secondary | ICD-10-CM

## 2015-06-10 DIAGNOSIS — Z1212 Encounter for screening for malignant neoplasm of rectum: Secondary | ICD-10-CM | POA: Diagnosis not present

## 2015-06-10 DIAGNOSIS — Z85038 Personal history of other malignant neoplasm of large intestine: Secondary | ICD-10-CM

## 2015-06-10 DIAGNOSIS — Z1151 Encounter for screening for human papillomavirus (HPV): Secondary | ICD-10-CM | POA: Diagnosis present

## 2015-06-10 DIAGNOSIS — Z79890 Hormone replacement therapy (postmenopausal): Secondary | ICD-10-CM

## 2015-06-10 LAB — HEMOCCULT GUIAC POC 1CARD (OFFICE): Fecal Occult Blood, POC: NEGATIVE

## 2015-06-10 MED ORDER — ESTRADIOL 0.05 MG/24HR TD PTTW: 1.0000 | MEDICATED_PATCH | TRANSDERMAL | Status: DC

## 2015-06-10 MED ORDER — PROGESTERONE MICRONIZED 100 MG PO CAPS: ORAL_CAPSULE | ORAL | Status: DC

## 2015-06-10 NOTE — Progress Notes (Signed)
Patient ID: Jane Mclean, female   DOB: 07-05-1956, 59 y.o.   MRN: [id number] History of Present Illness: Jane Mclean is a 59 year old white female,married in for a well woman gyn exam and pap.No complaints,happy with HRT. PCP is Dr Wolfgang Phoenix.    Current Medications, Allergies, Past Medical History, Past Surgical History, Family History and Social History were reviewed in [employer] record.     Review of Systems: Patient denies any headaches, hearing loss, fatigue, blurred vision, shortness of breath, chest pain, abdominal pain, problems with bowel movements, urination, or intercourse. No joint pain or mood swings.    Physical Exam:BP 154/90 mmHg  Pulse 68  Ht 5' 3.25" (1.607 m)  Wt 163 lb 8 oz (74.163 kg)  BMI 28.72 kg/m2 General:  Well developed, well nourished, no acute distress Skin:  Warm and dry Neck:  Midline trachea, normal thyroid, good ROM, no lymphadenopathy Lungs; Clear to auscultation bilaterally Breast:  No dominant palpable mass, retraction, or nipple discharge Cardiovascular: Regular rate and rhythm Abdomen:  Soft, non tender, no hepatosplenomegaly Pelvic:  External genitalia is normal in appearance, no lesions.  The vagina is normal in appearance. Urethra has no lesions or masses. The cervix is smooth,pap with HPV performed. Uterus is felt to be normal size, shape, and contour.  No adnexal masses or tenderness noted.Bladder is non tender, no masses felt. Rectal: Good sphincter tone, no polyps, or hemorrhoids felt.  Hemoccult negative. Extremities/musculoskeletal:  No swelling or varicosities noted, no clubbing or cyanosis Psych:  No mood changes, alert and cooperative,seems happy   Impression: Well woman gyn exam and pap HRT History of colon cancer    Plan: Physical in 1 year,pap in 3 if normal Labs with PCP Mammogram yearly Colonoscopy per GI Refilled Prometrium 100 mg #12 take 1 for 12 days every 3 months with 4 refills Refill Vivelle  dot x  1year

## 2015-06-10 NOTE — Patient Instructions (Signed)
Physical in  1 year,pap in 3 if normal Mammogram yearly Labs with PCP  Colonoscopy per GI

## 2015-06-12 LAB — CYTOLOGY - PAP

## 2015-06-18 ENCOUNTER — Other Ambulatory Visit: Payer: Self-pay | Admitting: Adult Health

## 2015-06-18 DIAGNOSIS — Z1231 Encounter for screening mammogram for malignant neoplasm of breast: Secondary | ICD-10-CM

## 2015-06-20 ENCOUNTER — Telehealth: Payer: Self-pay | Admitting: Adult Health

## 2015-06-20 MED ORDER — OLOPATADINE HCL 0.6 % NA SOLN: NASAL | Status: DC

## 2015-06-20 NOTE — Telephone Encounter (Signed)
No samples available 

## 2015-06-20 NOTE — Progress Notes (Signed)
FOLLOW UP NOTE  RE: Jane Mclean MRN: [id number] DOB: 1956-07-30 [facility] [place] [address] [place] [address] Date of Office Visit: 06/10/2015  Subjective:  Jane Mclean is a 59 y.o. female who presents today for Follow-up   Assessment:   1. Allergic rhinoconjunctivitis    Plan:  1.  Jane Mclean will continue the current regime as it is working well for her. 2.  In December, she will try to decrease Patanase to every other day and monitor closely for recurring difficulties. 3.  In Late February may increase Patanase, typically her greater symptom next season and add back saline nasal wash. 4.  Follow-up in March 2017 or sooner if needed.  Meds ordered this encounter  Medications  . Olopatadine HCl (PATANASE) 0.6 % SOLN    Sig: 1-2 sprays each nostril once to twice daily.    Dispense:  1 Bottle    Refill:  5    HPI: Jane Mclean returns to the office in follow-up of allergic rhinoconjunctivitis.  She reports feeling very well and feels medications are definitely beneficial.  She describes her sleep and activity as normal, even with the fluctuant weather patterns.  She denies any new concerns.  Current Medications: 1.  Patanase one to 2 sprays once twice daily. 2.  Saline nasal lavage as needed. 3.  Continue Synthroid, Vivelle-Dot and Flintstones with iron.  Drug Allergies: Allergies  Allergen Reactions  . Pyridium [Phenazopyridine Hcl] Nausea And Vomiting  . Penicillins Rash    Objective:   Filed Vitals:   06/10/15 1636  BP: 132/80  Pulse: 73  Temp: 98 F (36.7 C)  Resp: 20   Physical Exam  Constitutional: She is well-developed, well-nourished, and in no distress.  HENT:  Head: Atraumatic.  Right Ear: Tympanic membrane and ear canal normal.  Left Ear: Tympanic membrane and ear canal normal.  Nose: No mucosal edema or rhinorrhea. No epistaxis.  Mouth/Throat: Oropharynx is clear and moist and  mucous membranes are normal. No oropharyngeal exudate, posterior oropharyngeal edema or posterior oropharyngeal erythema.  Neck: Neck supple.  Cardiovascular: Normal rate, S1 normal and S2 normal.   No murmur heard. Pulmonary/Chest: Effort normal. She has no wheezes. She has no rhonchi. She has no rales.  Lymphadenopathy:    She has no cervical adenopathy.       Jane Mclean M. Ishmael Holter, MD  cc: Rosemary Holms, MD

## 2015-07-09 ENCOUNTER — Ambulatory Visit (HOSPITAL_COMMUNITY): Payer: BC Managed Care – PPO

## 2015-07-16 ENCOUNTER — Ambulatory Visit (INDEPENDENT_AMBULATORY_CARE_PROVIDER_SITE_OTHER): Payer: BC Managed Care – PPO | Admitting: Family Medicine

## 2015-07-16 ENCOUNTER — Encounter: Payer: Self-pay | Admitting: Family Medicine

## 2015-07-16 VITALS — BP 124/78 | Temp 98.1°F | Ht 63.0 in | Wt 163.1 lb

## 2015-07-16 DIAGNOSIS — B9689 Other specified bacterial agents as the cause of diseases classified elsewhere: Secondary | ICD-10-CM

## 2015-07-16 DIAGNOSIS — J019 Acute sinusitis, unspecified: Secondary | ICD-10-CM

## 2015-07-16 MED ORDER — CEFDINIR 300 MG PO CAPS: 300.0000 mg | ORAL_CAPSULE | Freq: Two times a day (BID) | ORAL | Status: DC

## 2015-07-16 NOTE — Progress Notes (Signed)
   Subjective:    Patient ID: Jane Mclean, female    DOB: Mar 06, 1956, 59 y.o.   MRN: [id number]  Sinusitis This is a new problem. The current episode started 1 to 4 weeks ago. The problem is unchanged. There has been no fever. The pain is moderate. Associated symptoms include congestion, coughing and a sore throat. Pertinent negatives include no ear pain or shortness of breath. (Runny nose) Past treatments include oral decongestants. The treatment provided no relief.     PMH benign no wheezing no high fevers  Review of Systems  Constitutional: Negative for fever and activity change.  HENT: Positive for congestion, rhinorrhea and sore throat. Negative for ear pain.   Eyes: Negative for discharge.  Respiratory: Positive for cough. Negative for shortness of breath and wheezing.   Cardiovascular: Negative for chest pain.       Objective:   Physical Exam  Constitutional: She appears well-developed.  HENT:  Head: Normocephalic.  Nose: Nose normal.  Mouth/Throat: Oropharynx is clear and moist. No oropharyngeal exudate.  Neck: Neck supple.  Cardiovascular: Normal rate and normal heart sounds.   No murmur heard. Pulmonary/Chest: Effort normal and breath sounds normal. She has no wheezes.  Lymphadenopathy:    She has no cervical adenopathy.  Skin: Skin is warm and dry.  Nursing note and vitals reviewed.   patient was not toxic no sign of meningitis or pneumonia exam is consistent with sinusitis with moderate sinus tenderness       Assessment & Plan:  Patient was seen today for upper respiratory illness. It is felt that the patient is dealing with sinusitis. Antibiotics were prescribed today. Importance of compliance with medication was discussed. Symptoms should gradually resolve over the course of the next several days. If high fevers, progressive illness, difficulty breathing, worsening condition or failure for symptoms to improve over the next several days then the patient is to  follow-up. If any emergent conditions the patient is to follow-up in the emergency department otherwise to follow-up in the office.

## 2015-10-02 ENCOUNTER — Telehealth: Payer: Self-pay | Admitting: *Deleted

## 2015-10-02 NOTE — Telephone Encounter (Signed)
[facility] pharmacy requesting clarification on Vivelle Dot. Original order states twice and once a week. Per Derrek Monaco, NP, Vivelle Dot apply to skin twice weekly.

## 2015-10-09 ENCOUNTER — Other Ambulatory Visit: Payer: Self-pay | Admitting: Adult Health

## 2015-10-09 DIAGNOSIS — Z1231 Encounter for screening mammogram for malignant neoplasm of breast: Secondary | ICD-10-CM

## 2015-10-16 ENCOUNTER — Ambulatory Visit (HOSPITAL_COMMUNITY)
Admission: RE | Admit: 2015-10-16 | Discharge: 2015-10-16 | Disposition: A | Discharge status: Home / Self Care | Payer: BC Managed Care – PPO | Source: Ambulatory Visit | Attending: Adult Health | Admitting: Adult Health

## 2015-10-16 DIAGNOSIS — Z1231 Encounter for screening mammogram for malignant neoplasm of breast: Secondary | ICD-10-CM | POA: Insufficient documentation

## 2015-12-02 ENCOUNTER — Ambulatory Visit: Payer: BC Managed Care – PPO | Admitting: Allergy and Immunology

## 2016-03-04 ENCOUNTER — Encounter: Payer: Self-pay | Admitting: Family Medicine

## 2016-03-04 ENCOUNTER — Ambulatory Visit (INDEPENDENT_AMBULATORY_CARE_PROVIDER_SITE_OTHER): Payer: BC Managed Care – PPO | Admitting: Family Medicine

## 2016-03-04 VITALS — BP 122/84 | Ht 63.0 in | Wt 161.0 lb

## 2016-03-04 DIAGNOSIS — E039 Hypothyroidism, unspecified: Secondary | ICD-10-CM | POA: Diagnosis not present

## 2016-03-04 DIAGNOSIS — E785 Hyperlipidemia, unspecified: Secondary | ICD-10-CM | POA: Diagnosis not present

## 2016-03-04 DIAGNOSIS — Z79899 Other long term (current) drug therapy: Secondary | ICD-10-CM | POA: Diagnosis not present

## 2016-03-04 MED ORDER — LEVOTHYROXINE SODIUM 100 MCG PO TABS: 100.0000 ug | ORAL_TABLET | Freq: Every day | ORAL | 2 refills | Status: DC

## 2016-03-04 NOTE — Progress Notes (Signed)
   Subjective:    Patient ID: Jane Mclean, female    DOB: 29-Jun-1956, 59 y.o.   MRN: [id number]  HPI Thyroid faithfully  Forever on the dose of 100 mcg    Patient arrives for a follow up on thyroid. 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 Next  Continues to struggle with chronichoarseness and dysphonia undermanagmet of specialist word trying injections    oseoga ,  Results for orders placed or performed in visit on 06/10/15  POCT occult blood stool  Result Value Ref Range   Fecal Occult Blood, POC Negative Negative   Card #1 Date     Card #2 Fecal Occult Blod, POC     Card #2 Date     Card #3 Fecal Occult Blood, POC     Card #3 Date    Cytology - PAP  Result Value Ref Range   CYTOLOGY - PAP PAP RESULT     Review of Systems No headache, no major weight loss or weight gain, no chest pain no back pain abdominal pain no change in bowel habits complete ROS otherwise negative     Objective:   Physical Exam Alert vitals stable, NAD. Blood pressure good on repeat. HEENT normal. Lungs clear. Heart regular rate and rhythm.        Assessment & Plan:   impression 1 hypotyroidism status ncertain discs prio blood work reiewed no symtms of high or low plan edcaions refilled. Appropiate blood workdt exers iscusd WSL

## 2016-03-12 LAB — LIPID PANEL
CHOLESTEROL TOTAL: 167 mg/dL (ref 100–199)
Chol/HDL Ratio: 3.1 ratio units (ref 0.0–4.4)
HDL: 54 mg/dL (ref >39)
LDL CALC: 98 mg/dL (ref 0–99)
TRIGLYCERIDES: 77 mg/dL (ref 0–149)
VLDL CHOLESTEROL CAL: 15 mg/dL (ref 5–40)

## 2016-03-12 LAB — HEPATIC FUNCTION PANEL
ALT: 11 IU/L (ref 0–32)
AST: 15 IU/L (ref 0–40)
Albumin: 4.3 g/dL (ref 3.5–5.5)
Alkaline Phosphatase: 92 IU/L (ref 39–117)
BILIRUBIN, DIRECT: 0.1 mg/dL (ref 0.00–0.40)
Bilirubin Total: 0.3 mg/dL (ref 0.0–1.2)
Total Protein: 7.1 g/dL (ref 6.0–8.5)

## 2016-03-12 LAB — TSH: TSH: 2.58 u[IU]/mL (ref 0.450–4.500)

## 2016-03-12 LAB — BASIC METABOLIC PANEL
BUN/Creatinine Ratio: 15 (ref 9–23)
BUN: 14 mg/dL (ref 6–24)
CALCIUM: 9.1 mg/dL (ref 8.7–10.2)
CO2: 25 mmol/L (ref 18–29)
CREATININE: 0.95 mg/dL (ref 0.57–1.00)
Chloride: 101 mmol/L (ref 96–106)
GFR, EST AFRICAN AMERICAN: 76 mL/min/{1.73_m2} (ref >59)
GFR, EST NON AFRICAN AMERICAN: 66 mL/min/{1.73_m2} (ref >59)
GLUCOSE: 86 mg/dL (ref 65–99)
POTASSIUM: 4.2 mmol/L (ref 3.5–5.2)
SODIUM: 139 mmol/L (ref 134–144)

## 2016-03-12 LAB — T4, FREE: Free T4: 1.33 ng/dL (ref 0.82–1.77)

## 2016-06-14 ENCOUNTER — Encounter: Payer: Self-pay | Admitting: Adult Health

## 2016-06-14 ENCOUNTER — Ambulatory Visit (INDEPENDENT_AMBULATORY_CARE_PROVIDER_SITE_OTHER): Payer: BC Managed Care – PPO | Admitting: Adult Health

## 2016-06-14 VITALS — BP 116/72 | HR 76 | Ht 63.25 in | Wt 162.0 lb

## 2016-06-14 DIAGNOSIS — Z01419 Encounter for gynecological examination (general) (routine) without abnormal findings: Secondary | ICD-10-CM

## 2016-06-14 DIAGNOSIS — Z1212 Encounter for screening for malignant neoplasm of rectum: Secondary | ICD-10-CM

## 2016-06-14 DIAGNOSIS — Z85038 Personal history of other malignant neoplasm of large intestine: Secondary | ICD-10-CM

## 2016-06-14 LAB — HEMOCCULT GUIAC POC 1CARD (OFFICE): Fecal Occult Blood, POC: NEGATIVE

## 2016-06-14 NOTE — Patient Instructions (Addendum)
Physical  in 1 year ,pap 2019 Mammogram yearly Colonoscopy per GI Labs with PCP

## 2016-06-14 NOTE — Progress Notes (Signed)
Patient ID: Jane Mclean, female   DOB: 02-21-1956, 60 y.o.   MRN: [id number] History of Present Illness: Jane Mclean is a 60 year old white female, in for well woman gyn exam, had normal pap with negative HPV 06/10/15. PCP is Mickie Hillier.   Current Medications, Allergies, Past Medical History, Past Surgical History, Family History and Social History were reviewed in [employer] record.     Review of Systems:  Patient denies any headaches, hearing loss, fatigue, blurred vision, shortness of breath, chest pain, abdominal pain, problems with bowel movements, urination, or intercourse. 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   Physical Exam:BP 116/72 (BP Location: Left Arm, Patient Position: Sitting, Cuff Size: Normal)   Pulse 76   Ht 5' 3.25" (1.607 m)   Wt 162 lb (73.5 kg)   BMI 28.47 kg/m  General:  Well developed, well nourished, no acute distress Skin:  Warm and dry Neck:  Midline trachea, normal thyroid, good ROM, no lymphadenopathy Lungs; Clear to auscultation bilaterally Breast:  No dominant palpable mass, retraction, or nipple discharge Cardiovascular: Regular rate and rhythm Abdomen:  Soft, non tender, no hepatosplenomegaly Pelvic:  External genitalia is normal in appearance, no lesions.  The vagina is normal in appearance. Urethra has no lesions or masses. The cervix is smooth.  Uterus is felt to be normal size, shape, and contour.  No adnexal masses or tenderness noted.Bladder is non tender, no masses felt. Rectal: Good sphincter tone, no polyps, or hemorrhoids felt.  Hemoccult negative. Extremities/musculoskeletal:  No swelling or varicosities noted, no clubbing or cyanosis Psych:  No mood changes, alert and cooperative,seems happy PHQ 2 score 0. Will stop HRT now.   Impression: 1. Encounter for gynecological examination without abnormal finding   2. History of colon  cancer       Plan: Stop HRT now and if has any severe hot flashes let me know, can restart if desired  Physical  in 1 year, pap 2019 Mammogram yearly Colonoscopy per GI Labs with PCP

## 2016-09-20 ENCOUNTER — Other Ambulatory Visit: Payer: Self-pay | Admitting: Adult Health

## 2016-09-20 DIAGNOSIS — Z1231 Encounter for screening mammogram for malignant neoplasm of breast: Secondary | ICD-10-CM

## 2016-10-18 ENCOUNTER — Ambulatory Visit (HOSPITAL_COMMUNITY): Payer: BC Managed Care – PPO

## 2016-11-08 ENCOUNTER — Ambulatory Visit (HOSPITAL_COMMUNITY)
Admission: RE | Admit: 2016-11-08 | Discharge: 2016-11-08 | Disposition: A | Discharge status: Home / Self Care | Payer: BC Managed Care – PPO | Source: Ambulatory Visit | Attending: Adult Health | Admitting: Adult Health

## 2016-11-08 DIAGNOSIS — Z1231 Encounter for screening mammogram for malignant neoplasm of breast: Secondary | ICD-10-CM | POA: Diagnosis not present

## 2016-12-01 ENCOUNTER — Ambulatory Visit (INDEPENDENT_AMBULATORY_CARE_PROVIDER_SITE_OTHER): Payer: BC Managed Care – PPO | Admitting: Family Medicine

## 2016-12-01 ENCOUNTER — Encounter: Payer: Self-pay | Admitting: Family Medicine

## 2016-12-01 VITALS — BP 118/72 | Temp 98.2°F | Ht 63.25 in | Wt 161.4 lb

## 2016-12-01 DIAGNOSIS — B9689 Other specified bacterial agents as the cause of diseases classified elsewhere: Secondary | ICD-10-CM

## 2016-12-01 DIAGNOSIS — J019 Acute sinusitis, unspecified: Secondary | ICD-10-CM | POA: Diagnosis not present

## 2016-12-01 MED ORDER — LEVOTHYROXINE SODIUM 100 MCG PO TABS: 100.0000 ug | ORAL_TABLET | Freq: Every day | ORAL | 0 refills | Status: DC

## 2016-12-01 MED ORDER — CEFDINIR 300 MG PO CAPS
300.0000 mg | ORAL_CAPSULE | Freq: Two times a day (BID) | ORAL | 0 refills | Status: DC
Start: 2016-12-01 — End: 2017-01-02

## 2016-12-01 NOTE — Progress Notes (Signed)
   Subjective:    Patient ID: Jane Mclean, female    DOB: 12-Jul-1956, 62 y.o.   MRN: [id number]  Sinus Problem  This is a new problem. The current episode started in the past 7 days. Associated symptoms include congestion, coughing, ear pain, headaches and a sore throat. Treatments tried: nyquil, dayquil.   Patient would like a 90 refill of synthroid  Felt bad last wed  Stayed home thur and Friday  Last thur and fri not a bad cough  noo si g flu symtoms  Felt like just a cold  Now notes left ear pain   Some prod cough '' Frontal head ache dim enrgy      Review of Systems  HENT: Positive for congestion, ear pain and sore throat.   Respiratory: Positive for cough.   Neurological: Positive for headaches.       Objective:   Physical Exam  Alert, mild malaise. Hydration good Vitals stable. frontal/ maxillary tenderness evident positive nasal congestion. pharynx normal neck supple  lungs clear/no crackles or wheezes. heart regular in rhythm       Assessment & Plan:  Impression rhinosinusitis likely post viral, discussed with patient. plan antibiotics prescribed. Questions answered. Symptomatic care discussed. warning signs discussed. WSL

## 2017-01-02 ENCOUNTER — Encounter (HOSPITAL_COMMUNITY): Payer: Self-pay | Admitting: Emergency Medicine

## 2017-01-02 ENCOUNTER — Emergency Department (HOSPITAL_COMMUNITY): Payer: BC Managed Care – PPO

## 2017-01-02 ENCOUNTER — Emergency Department (HOSPITAL_COMMUNITY)
Admission: EM | Admit: 2017-01-02 | Discharge: 2017-01-02 | Disposition: A | Discharge status: Home / Self Care | Payer: BC Managed Care – PPO | Attending: Emergency Medicine | Admitting: Emergency Medicine

## 2017-01-02 DIAGNOSIS — Z85038 Personal history of other malignant neoplasm of large intestine: Secondary | ICD-10-CM | POA: Diagnosis not present

## 2017-01-02 DIAGNOSIS — R002 Palpitations: Secondary | ICD-10-CM | POA: Diagnosis present

## 2017-01-02 DIAGNOSIS — Z79899 Other long term (current) drug therapy: Secondary | ICD-10-CM | POA: Insufficient documentation

## 2017-01-02 DIAGNOSIS — E039 Hypothyroidism, unspecified: Secondary | ICD-10-CM | POA: Insufficient documentation

## 2017-01-02 DIAGNOSIS — R000 Tachycardia, unspecified: Secondary | ICD-10-CM | POA: Diagnosis not present

## 2017-01-02 LAB — CBC
HEMATOCRIT: 40.3 % (ref 36.0–46.0)
HEMOGLOBIN: 13.4 g/dL (ref 12.0–15.0)
MCH: 30 pg (ref 26.0–34.0)
MCHC: 33.3 g/dL (ref 30.0–36.0)
MCV: 90.4 fL (ref 78.0–100.0)
Platelets: 419 10*3/uL — ABNORMAL HIGH (ref 150–400)
RBC: 4.46 MIL/uL (ref 3.87–5.11)
RDW: 13.3 % (ref 11.5–15.5)
WBC: 8.5 10*3/uL (ref 4.0–10.5)

## 2017-01-02 LAB — I-STAT TROPONIN, ED: Troponin i, poc: 0 ng/mL (ref 0.00–0.08)

## 2017-01-02 LAB — BASIC METABOLIC PANEL
ANION GAP: 9 (ref 5–15)
BUN: 19 mg/dL (ref 6–20)
CO2: 27 mmol/L (ref 22–32)
Calcium: 9.2 mg/dL (ref 8.9–10.3)
Chloride: 106 mmol/L (ref 101–111)
Creatinine, Ser: 1.14 mg/dL — ABNORMAL HIGH (ref 0.44–1.00)
GFR calc Af Amer: 60 mL/min — ABNORMAL LOW (ref >60)
GFR calc non Af Amer: 52 mL/min — ABNORMAL LOW (ref >60)
GLUCOSE: 110 mg/dL — AB (ref 65–99)
POTASSIUM: 3.7 mmol/L (ref 3.5–5.1)
Sodium: 142 mmol/L (ref 135–145)

## 2017-01-02 LAB — TSH: TSH: 2.125 u[IU]/mL (ref 0.350–4.500)

## 2017-01-02 LAB — TROPONIN I

## 2017-01-02 MED ORDER — PANTOPRAZOLE SODIUM 40 MG PO TBEC
40.0000 mg | DELAYED_RELEASE_TABLET | Freq: Every day | ORAL | Status: DC
  Administered 2017-01-02: 40 mg via ORAL
  Filled 2017-01-02: qty 1

## 2017-01-02 MED ORDER — LEVOTHYROXINE SODIUM 50 MCG PO TABS
100.0000 ug | ORAL_TABLET | Freq: Every day | ORAL | Status: DC
  Administered 2017-01-02: 100 ug via ORAL
  Filled 2017-01-02: qty 2

## 2017-01-02 NOTE — ED Triage Notes (Signed)
Patient states that she woke this morning with palpitations. Patient states chest tightness and indigestion. Denies any shortness of breath, dizziness, nausea, vomiting, or weakness. Denies any cardiac hx other that palpitations related to menopause. Patient states this episode lasted longer then normal and was slightly different.

## 2017-01-02 NOTE — Discharge Instructions (Signed)
Your second troponin was negative. Recommend follow-up with Dr. Wolfgang Phoenix or with cardiology division of [facility]. Phone number given in your discharge instructions. Return here for any concerns.

## 2017-01-02 NOTE — ED Provider Notes (Addendum)
[facility] DEPT Provider Note   CSN: [id number] Arrival date & time: 01/02/17  0736     History   Chief Complaint Chief Complaint  Patient presents with  . Palpitations    HPI Jane Mclean is a 61 y.o. female.  Patient presents with a sensation of palpitations at approximately 6:30 AM today. She felt her pulse was rapid and irregular with a tightness in her chest and burping. No dyspnea, diaphoresis, nausea. No previous cardiac history. Nonsmoker. She does have hypothyroidism secondary to thyroiditis many years ago and takes Synthroid 100 g daily. Symptoms have eased off of the present.  time.      Past Medical History:  Diagnosis Date  . Colon cancer ([place])   . FH: migraines   . Hypothyroid   . Postmenopausal HRT (hormone replacement therapy) 05/21/2013  . Rosacea   . Vocal cord atrophy     Patient Active Problem List   Diagnosis Date Noted  . Encounter for gynecological examination without abnormal finding 06/14/2016  . Rotator cuff syndrome of right shoulder 06/06/2013  . Postmenopausal HRT (hormone replacement therapy) 05/21/2013  . Hypothyroid 05/21/2013  . 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 Radicular pain 03/27/2013  . Right hip pain 03/27/2013  . Bursitis of right hip 03/27/2013  . Back pain 03/27/2013  . Unspecified hypothyroidism 03/12/2013  . History of colon cancer 03/12/2013    Past Surgical History:  Procedure Laterality Date  . CESAREAN SECTION    . CHOLECYSTECTOMY  1989  . colonectomy  2006   partial  . ENDOMETRIAL ABLATION    . TUBAL LIGATION    . VOCAL CORD INJECTION      OB History    Gravida Para Term Preterm AB Living   3 3       3    SAB TAB Ectopic Multiple Live Births           3       Home Medications    Prior to Admission medications   Medication Sig Start Date End Date Taking? Authorizing Provider  Azelaic Acid (FINACEA) 15 % cream Apply topically 2 (two) times daily. After skin is thoroughly washed and patted  dry, gently but thoroughly massage a thin film of azelaic acid cream into the affected area twice daily, in the morning and evening.    [provider]  cefdinir (OMNICEF) 300 MG capsule Take 1 capsule (300 mg total) by mouth 2 (two) times daily. 12/01/16   Mikey Kirschner, MD  levothyroxine (SYNTHROID, LEVOTHROID) 100 MCG tablet Take 1 tablet (100 mcg total) by mouth daily before breakfast. 12/01/16   Mikey Kirschner, MD  Multiple Vitamin (MULTIVITAMIN) tablet Take 1 tablet by mouth daily.    [provider]  Olopatadine HCl (PATANASE) 0.6 % SOLN 1-2 sprays each nostril once to twice daily. Patient not taking: Reported on 06/14/2016 06/20/15   Gean Quint, MD  omeprazole (PRILOSEC) 20 MG capsule Take 20 mg by mouth daily.    [provider]    Family History Family History  Problem Relation Age of Onset  . Glaucoma Mother   . Parkinson's disease Father   . Macular degeneration Maternal Aunt   . Hypertension Maternal Grandmother   . Stroke Maternal Grandmother   . Crohn's disease Sister   . Migraines Sister   . Other Brother        back problems  . Macular degeneration Maternal Uncle     Social History Social History  Substance Use Topics  .  Smoking status: Never Smoker  . Smokeless tobacco: Never Used  . Alcohol use No     Allergies   Pyridium [phenazopyridine hcl]; Shellfish allergy; and Penicillins   Review of Systems Review of Systems  All other systems reviewed and are negative.    Physical Exam Updated Vital Signs BP (S) (!) 154/100 (BP Location: Left Arm)   Pulse (!) 109   Temp 97.7 F (36.5 C) (Oral)   Resp 18   Ht 5\' 3"  (1.6 m)   Wt 72.6 kg (160 lb)   SpO2 100%   BMI 28.34 kg/m   Physical Exam  Constitutional: She is oriented to person, place, and time. She appears well-developed and well-nourished.  HENT:  Head: Normocephalic and atraumatic.  Eyes: Conjunctivae are normal.  Neck: Neck supple.  Cardiovascular:  Normal rate and regular rhythm.   Tachycardic rate 105  Pulmonary/Chest: Effort normal and breath sounds normal.  Abdominal: Soft. Bowel sounds are normal.  Musculoskeletal: Normal range of motion.  Neurological: She is alert and oriented to person, place, and time.  Skin: Skin is warm and dry.  Psychiatric: She has a normal mood and affect. Her behavior is normal.  Nursing note and vitals reviewed.    ED Treatments / Results  Labs (all labs ordered are listed, but only abnormal results are displayed) Labs Reviewed  BASIC METABOLIC PANEL  CBC  TSH  I-STAT [place], ED    EKG  EKG Interpretation None       Radiology No results found.  Procedures Procedures (including critical care time)  Medications Ordered in ED Medications - No data to display   Initial Impression / Assessment and Plan / ED Course  I have reviewed the triage vital signs and the nursing notes.  Pertinent labs & imaging results that were available during my care of the patient were reviewed by me and considered in my medical decision making (see chart for details).     Patient is hemodynamically stable. Doubt acute coronary syndrome. She could've had a brief bout of atrial fibrillation. Will check basic labs and TSH.  Second troponin negative. Patient remains in sinus rhythm. Her TSH was normal. Patient has primary care follow-up with a local family practitioner. I also recommended a cardiology consult. Phone number given. She will return if her symptoms worsen.  Final Clinical Impressions(s) / ED Diagnoses   Final diagnoses:  Palpitations  Sinus tachycardia    New Prescriptions New Prescriptions   No medications on file     Nat Christen, MD 01/02/17 0981    Nat Christen, MD 01/02/17 1309

## 2017-01-03 LAB — CBG MONITORING, ED: Glucose-Capillary: 119 mg/dL — ABNORMAL HIGH (ref 65–99)

## 2017-01-04 ENCOUNTER — Encounter: Payer: Self-pay | Admitting: Family Medicine

## 2017-01-04 ENCOUNTER — Ambulatory Visit (INDEPENDENT_AMBULATORY_CARE_PROVIDER_SITE_OTHER): Payer: BC Managed Care – PPO | Admitting: Family Medicine

## 2017-01-04 VITALS — BP 146/88 | Ht 63.25 in | Wt 160.0 lb

## 2017-01-04 DIAGNOSIS — R000 Tachycardia, unspecified: Secondary | ICD-10-CM | POA: Diagnosis not present

## 2017-01-04 NOTE — Progress Notes (Signed)
   Subjective:    Patient ID: Jane Mclean, female    DOB: Aug 21, 1955, 61 y.o.   MRN: [id number]  Palpitations   This is a new problem. The current episode started in the past 7 days. The problem has been resolved.   Patient in today for a ER follow up for Palpitations. Patient was seen at the ER on Sunday 01/02/17.  Pt awoke with very rapid hear t trate   Actually woke her up  Would skip a beat on occasion   Lasted an hour  occas "brown out" transient, seconds.    Pt rtried breathing slower  Fairly raped , with quik beat  No true chest pain, felt it going on  The chest  No sob   Complete hospital record reviewed in patient's presence   States no other concerns this visit.   Review of Systems  Cardiovascular: Positive for palpitations.  No headache, no major weight loss or weight gain, no chest pain no back pain abdominal pain no change in bowel habits complete ROS otherwise negative      Objective:   Physical Exam  Alert and oriented, vitals reviewed and stable, NAD ENT-TM's and ext canals WNL bilat via otoscopic exam Soft palate, tonsils and post pharynx WNL via oropharyngeal exam Neck-symmetric, no masses; thyroid nonpalpable and nontender Pulmonary-no tachypnea or accessory muscle use; Clear without wheezes via auscultation Card--no abnrml murmurs, rhythm reg and rate WNL Carotid pulses symmetric, without bruits   EKG reviewed      Assessment & Plan:  Impression transient tachyarrhythmia. More than just palpitations. Sustained and very noticeable by this very self aware patient. Likely either paroxysmal supraventricular tachycardia or transient atrial fibrillation. Either way deserves an evaluation by cardiologist. Advised her likely echocardiogram and some type of Holter monitoring. May or may not reveal etiology but initial workup is advisable. Long discussion held  Greater than 50% of this 25 minute face to face visit was spent in counseling and  discussion and coordination of care regarding the above diagnosis/diagnosies

## 2017-01-19 ENCOUNTER — Ambulatory Visit: Payer: BC Managed Care – PPO | Admitting: Cardiology

## 2017-01-24 ENCOUNTER — Telehealth: Payer: Self-pay | Admitting: Family Medicine

## 2017-01-24 DIAGNOSIS — E039 Hypothyroidism, unspecified: Secondary | ICD-10-CM

## 2017-01-24 DIAGNOSIS — Z13220 Encounter for screening for lipoid disorders: Secondary | ICD-10-CM

## 2017-01-24 DIAGNOSIS — R7309 Other abnormal glucose: Secondary | ICD-10-CM

## 2017-01-24 DIAGNOSIS — Z79899 Other long term (current) drug therapy: Secondary | ICD-10-CM

## 2017-01-24 NOTE — Telephone Encounter (Signed)
Left message return call 01/24/17

## 2017-01-24 NOTE — Telephone Encounter (Signed)
Patient is requesting her labs to be ordered.  She has an appointment on 03/11/17 with Dr. Richardson Landry.

## 2017-01-24 NOTE — Telephone Encounter (Signed)
Lip liv  t4  glu, pt just had a tsh and m7 [place] week ago

## 2017-02-01 ENCOUNTER — Encounter: Payer: Self-pay | Admitting: Cardiology

## 2017-02-01 ENCOUNTER — Ambulatory Visit (INDEPENDENT_AMBULATORY_CARE_PROVIDER_SITE_OTHER): Payer: BC Managed Care – PPO | Admitting: Cardiology

## 2017-02-01 VITALS — BP 127/74 | HR 89 | Ht 63.0 in | Wt 161.6 lb

## 2017-02-01 DIAGNOSIS — R002 Palpitations: Secondary | ICD-10-CM

## 2017-02-01 NOTE — Progress Notes (Signed)
Clinical Summary [name] is a 61 y.o.female seen as new consult, referred by Dr Wolfgang Phoenix for palpitations.   1. Palpitations - ER visit 01/02/17 with symptoms. K 3.7, TSH 2.1, trop neg. EKG sinus tachycardia.   - episode while sleeping. Feeling of heart pounding and racing. Checked pulse, felt irregular and fast. No other associated symptoms. Lasted for 1 hour ,self terminated.  - no EtOH, drinks 1 cup of coffee daily, no sodas, no tea. - can have nonspecific episodes of lighteadness or dizziness.  - going through menopause - walks on treadmill daily, up to 45 minutes at brisk walk. - can have occasional fluttering at times, overall mild and infrequent.    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  Past Medical History:  Diagnosis Date  . Colon cancer ([place])   . FH: migraines   . Hypothyroid   . Postmenopausal HRT (hormone replacement therapy) 05/21/2013  . Rosacea   . Vocal cord atrophy      Allergies  Allergen Reactions  . Amoxicillin   . Ampicillin   . Hydrocodone-Acetaminophen Nausea And Vomiting  . Oxycodone   . Pyridium [Phenazopyridine Hcl] Nausea And Vomiting  . Shellfish Allergy   . Penicillins Hives and Rash    Has patient had a PCN reaction causing immediate rash, facial/tongue/throat swelling, SOB or lightheadedness with hypotension: Yes Has patient had a PCN reaction causing severe rash involving mucus membranes or skin necrosis: Yes Has patient had a PCN reaction that required hospitalization: No Has patient had a PCN reaction occurring within the last 10 years: No If all of the above answers are "NO", then may proceed with Cephalosporin use.  . Tramadol Rash     Current Outpatient Prescriptions  Medication Sig Dispense Refill  . Azelaic Acid (FINACEA) 15 % cream Apply topically 2 (two) times daily. After skin is thoroughly washed and patted dry, gently but thoroughly massage a thin film of azelaic acid cream into the affected area twice daily, in  the morning and evening.    Marland Kitchen levothyroxine (SYNTHROID, LEVOTHROID) 100 MCG tablet Take 1 tablet (100 mcg total) by mouth daily before breakfast. 90 tablet 0  . Multiple Vitamin (MULTIVITAMIN) tablet Take 1 tablet by mouth daily.    Marland Kitchen omeprazole (PRILOSEC) 20 MG capsule Take 20 mg by mouth daily.     No current facility-administered medications for this visit.      Past Surgical History:  Procedure Laterality Date  . CESAREAN SECTION    . CHOLECYSTECTOMY  1989  . colonectomy  2006   partial  . ENDOMETRIAL ABLATION    . TUBAL LIGATION    . VOCAL CORD INJECTION       Allergies  Allergen Reactions  . Amoxicillin   . Ampicillin   . Hydrocodone-Acetaminophen Nausea And Vomiting  . Oxycodone   . Pyridium [Phenazopyridine Hcl] Nausea And Vomiting  . Shellfish Allergy   . Penicillins Hives and Rash    Has patient had a PCN reaction causing immediate rash, facial/tongue/throat swelling, SOB or lightheadedness with hypotension: Yes Has patient had a PCN reaction causing severe rash involving mucus membranes or skin necrosis: Yes Has patient had a PCN reaction that required hospitalization: No Has patient had a PCN reaction occurring within the last 10 years: No If all of the above answers are "NO", then may proceed with Cephalosporin use.  . Tramadol Rash      Family History  Problem Relation Age of Onset  . Glaucoma Mother   .  Parkinson's disease Father   . Macular degeneration Maternal Aunt   . Hypertension Maternal Grandmother   . Stroke Maternal Grandmother   . Crohn's disease Sister   . Migraines Sister   . Other Brother        back problems  . Macular degeneration Maternal Uncle      Social History [name] reports that she has never smoked. She has never used smokeless tobacco. [name] reports that she does not drink alcohol.   Review of Systems CONSTITUTIONAL: No weight loss, fever, chills, weakness or fatigue.  HEENT: Eyes: No visual loss, blurred  vision, double vision or yellow sclerae.No hearing loss, sneezing, congestion, runny nose or sore throat.  SKIN: No rash or itching.  CARDIOVASCULAR: per hpi RESPIRATORY: No shortness of breath, cough or sputum.  GASTROINTESTINAL: No anorexia, nausea, vomiting or diarrhea. No abdominal pain or blood.  GENITOURINARY: No burning on urination, no polyuria NEUROLOGICAL: No headache, dizziness, syncope, paralysis, ataxia, numbness or tingling in the extremities. No change in bowel or bladder control.  MUSCULOSKELETAL: No muscle, back pain, joint pain or stiffness.  LYMPHATICS: No enlarged nodes. No history of splenectomy.  PSYCHIATRIC: No history of depression or anxiety.  ENDOCRINOLOGIC: No reports of sweating, cold or heat intolerance. No polyuria or polydipsia.  Marland Kitchen   Physical Examination Vitals:   02/01/17 0927 02/01/17 0932  BP: 132/77 127/74  Pulse: 89    . Vitals:   02/01/17 0927  Weight: 161 lb 9.6 oz (73.3 kg)  Height: 5\' 3"  (1.6 m)    Gen: resting comfortably, no acute distress HEENT: no scleral icterus, pupils equal round and reactive, no palptable cervical adenopathy,  CV: RRR, no m/r/g, no jvd Resp: Clear to auscultation bilaterally GI: abdomen is soft, non-tender, non-distended, normal bowel sounds, no hepatosplenomegaly MSK: extremities are warm, no edema.  Skin: warm, no rash Neuro:  no focal deficits Psych: appropriate affect    Assessment and Plan   1. Palpitations - overall infrequent symptoms, occur less than 1 per month. Her baseline EKG shows sinus tachycardia.  - work to limit caffeine intake. Monitor symptoms at this time, if increase consider event monitor at that time.    F/u 3 months  Arnoldo Lenis, M.D.

## 2017-02-01 NOTE — Patient Instructions (Signed)
Your physician recommends that you schedule a follow-up appointment in: 3 months with Dr Harl Bowie    Your physician recommends that you continue on your current medications as directed. Please refer to the Current Medication list given to you today.    If you need a refill on your cardiac medications before your next appointment, please call your pharmacy.    No testing or labs ordered today.      Thank you for choosing [facility] !

## 2017-03-04 LAB — HEPATIC FUNCTION PANEL
ALT: 14 IU/L (ref 0–32)
AST: 16 IU/L (ref 0–40)
Albumin: 4.3 g/dL (ref 3.5–5.5)
Alkaline Phosphatase: 104 IU/L (ref 39–117)
BILIRUBIN, DIRECT: 0.09 mg/dL (ref 0.00–0.40)
Bilirubin Total: 0.3 mg/dL (ref 0.0–1.2)
Total Protein: 7.1 g/dL (ref 6.0–8.5)

## 2017-03-04 LAB — LIPID PANEL
CHOL/HDL RATIO: 3 ratio (ref 0.0–4.4)
Cholesterol, Total: 169 mg/dL (ref 100–199)
HDL: 57 mg/dL (ref >39)
LDL CALC: 98 mg/dL (ref 0–99)
Triglycerides: 68 mg/dL (ref 0–149)
VLDL Cholesterol Cal: 14 mg/dL (ref 5–40)

## 2017-03-04 LAB — T4: T4 TOTAL: 9.4 ug/dL (ref 4.5–12.0)

## 2017-03-04 LAB — GLUCOSE, FASTING: Glucose, Plasma: 86 mg/dL (ref 65–99)

## 2017-03-11 ENCOUNTER — Ambulatory Visit (INDEPENDENT_AMBULATORY_CARE_PROVIDER_SITE_OTHER): Payer: BC Managed Care – PPO | Admitting: Family Medicine

## 2017-03-11 ENCOUNTER — Encounter: Payer: Self-pay | Admitting: Family Medicine

## 2017-03-11 VITALS — BP 124/80 | Ht 63.25 in | Wt 161.1 lb

## 2017-03-11 DIAGNOSIS — E039 Hypothyroidism, unspecified: Secondary | ICD-10-CM | POA: Diagnosis not present

## 2017-03-11 MED ORDER — LEVOTHYROXINE SODIUM 100 MCG PO TABS: 100.0000 ug | ORAL_TABLET | Freq: Every day | ORAL | 3 refills | Status: DC

## 2017-03-11 NOTE — Progress Notes (Signed)
   Subjective:    Patient ID: Jane Mclean, female    DOB: Jun 24, 1956, 61 y.o.   MRN: [id number]  HPI Patient is here for follow up on Hypothyroidism. She is currently taking Levothyroxine 100 mg one daily. Eats Healthy,and exercises daily. No other concerns.  Results for orders placed or performed in visit on 01/24/17  Lipid panel  Result Value Ref Range   Cholesterol, Total 169 100 - 199 mg/dL   Triglycerides 68 0 - 149 mg/dL   HDL 57 >39 mg/dL   VLDL Cholesterol Cal 14 5 - 40 mg/dL   LDL Calculated 98 0 - 99 mg/dL   Chol/HDL Ratio 3.0 0.0 - 4.4 ratio  Hepatic function panel  Result Value Ref Range   Total Protein 7.1 6.0 - 8.5 g/dL   Albumin 4.3 3.5 - 5.5 g/dL   Bilirubin Total 0.3 0.0 - 1.2 mg/dL   Bilirubin, Direct 0.09 0.00 - 0.40 mg/dL   Alkaline Phosphatase 104 39 - 117 IU/L   AST 16 0 - 40 IU/L   ALT 14 0 - 32 IU/L  T4  Result Value Ref Range   T4, Total 9.4 4.5 - 12.0 ug/dL  Glucose, fasting  Result Value Ref Range   Glucose, Plasma 86 65 - 99 mg/dL   Neg h r changes with exercise  No side effects with medicine. Compliant with thyroid. No symptoms of high or low. Regular exercise.  Review of Systems No headache, no major weight loss or weight gain, no chest pain no back pain abdominal pain no change in bowel habits complete ROS otherwise negative     Objective:   Physical Exam  Alert vitals stable, NAD. Blood pressure good on repeat. HEENT normal. Lungs clear. Heart regular rate and rhythm. Thyroid nonpalpable      Assessment & Plan:  Impression 1 hypothyroidism good control discussed plan maintain same dose diet exercise discussed compliance discussed

## 2017-05-05 ENCOUNTER — Encounter: Payer: Self-pay | Admitting: Cardiology

## 2017-05-05 ENCOUNTER — Ambulatory Visit (INDEPENDENT_AMBULATORY_CARE_PROVIDER_SITE_OTHER): Payer: BC Managed Care – PPO | Admitting: Cardiology

## 2017-05-05 VITALS — BP 118/82 | HR 74 | Ht 63.0 in | Wt 160.0 lb

## 2017-05-05 DIAGNOSIS — R000 Tachycardia, unspecified: Secondary | ICD-10-CM | POA: Diagnosis not present

## 2017-05-05 DIAGNOSIS — R002 Palpitations: Secondary | ICD-10-CM | POA: Diagnosis not present

## 2017-05-05 MED ORDER — METOPROLOL TARTRATE 25 MG PO TABS: 25.0000 mg | ORAL_TABLET | Freq: Two times a day (BID) | ORAL | 3 refills | Status: DC | PRN

## 2017-05-05 NOTE — Progress Notes (Signed)
Clinical Summary Jane Mclean is a 61 y.o.female seen today for follow up of the following medical problems.   1. Palpitations - ER visit 01/02/17 with symptoms. K 3.7, TSH 2.1, trop neg. EKG sinus tachycardia.   - episode while sleeping. Feeling of heart pounding and racing. Checked pulse, felt irregular and fast. No other associated symptoms. Lasted for 1 hour ,self terminated.  - no EtOH, drinks 1 cup of coffee daily, no sodas, no tea. - can have nonspecific episodes of lighteadness or dizziness.  - going through menopause - walks on treadmill daily, up to 45 minutes at brisk walk. - can have occasional fluttering at times, overall mild and infrequent.    - has had some repeat episodes since last visit. Repeat episode about 1 week ago while laying in bed. Episode lasted 45 minutes. She monitored by her fitbit and saw heart rates were 135-150, pulse felt irregular.    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  Past Medical History:  Diagnosis Date  . Colon cancer ([place])   . FH: migraines   . Hypothyroid   . Postmenopausal HRT (hormone replacement therapy) 05/21/2013  . Rosacea   . Vocal cord atrophy      Allergies  Allergen Reactions  . Amoxicillin   . Ampicillin   . Hydrocodone-Acetaminophen Nausea And Vomiting  . Oxycodone   . Pyridium [Phenazopyridine Hcl] Nausea And Vomiting  . Shellfish Allergy   . Penicillins Hives and Rash    Has patient had a PCN reaction causing immediate rash, facial/tongue/throat swelling, SOB or lightheadedness with hypotension: Yes Has patient had a PCN reaction causing severe rash involving mucus membranes or skin necrosis: Yes Has patient had a PCN reaction that required hospitalization: No Has patient had a PCN reaction occurring within the last 10 years: No If all of the above answers are "NO", then may proceed with Cephalosporin use.  . Tramadol Rash     Current Outpatient Prescriptions  Medication Sig Dispense Refill  .  Azelaic Acid (FINACEA) 15 % cream Apply topically 2 (two) times daily. After skin is thoroughly washed and patted dry, gently but thoroughly massage a thin film of azelaic acid cream into the affected area twice daily, in the morning and evening.    Marland Kitchen levothyroxine (SYNTHROID, LEVOTHROID) 100 MCG tablet Take 1 tablet (100 mcg total) by mouth daily before breakfast. 90 tablet 3  . Multiple Vitamin (MULTIVITAMIN) tablet Take 1 tablet by mouth daily.    Marland Kitchen omeprazole (PRILOSEC) 20 MG capsule Take 20 mg by mouth daily.     No current facility-administered medications for this visit.      Past Surgical History:  Procedure Laterality Date  . CESAREAN SECTION    . CHOLECYSTECTOMY  1989  . colonectomy  2006   partial  . ENDOMETRIAL ABLATION    . TUBAL LIGATION    . VOCAL CORD INJECTION       Allergies  Allergen Reactions  . Amoxicillin   . Ampicillin   . Hydrocodone-Acetaminophen Nausea And Vomiting  . Oxycodone   . Pyridium [Phenazopyridine Hcl] Nausea And Vomiting  . Shellfish Allergy   . Penicillins Hives and Rash    Has patient had a PCN reaction causing immediate rash, facial/tongue/throat swelling, SOB or lightheadedness with hypotension: Yes Has patient had a PCN reaction causing severe rash involving mucus membranes or skin necrosis: Yes Has patient had a PCN reaction that required hospitalization: No Has patient had a PCN reaction occurring within  the last 10 years: No If all of the above answers are "NO", then may proceed with Cephalosporin use.  . Tramadol Rash      Family History  Problem Relation Age of Onset  . Glaucoma Mother   . Parkinson's disease Father   . Macular degeneration Maternal Aunt   . Hypertension Maternal Grandmother   . Stroke Maternal Grandmother   . Crohn's disease Sister   . Migraines Sister   . Other Brother        back problems  . Macular degeneration Maternal Uncle      Social History Jane Mclean reports that she has never smoked.  She has never used smokeless tobacco. Jane Mclean reports that she does not drink alcohol.   Review of Systems CONSTITUTIONAL: No weight loss, fever, chills, weakness or fatigue.  HEENT: Eyes: No visual loss, blurred vision, double vision or yellow sclerae.No hearing loss, sneezing, congestion, runny nose or sore throat.  SKIN: No rash or itching.  CARDIOVASCULAR: per hpi RESPIRATORY: No shortness of breath, cough or sputum.  GASTROINTESTINAL: No anorexia, nausea, vomiting or diarrhea. No abdominal pain or blood.  GENITOURINARY: No burning on urination, no polyuria NEUROLOGICAL: No headache, dizziness, syncope, paralysis, ataxia, numbness or tingling in the extremities. No change in bowel or bladder control.  MUSCULOSKELETAL: No muscle, back pain, joint pain or stiffness.  LYMPHATICS: No enlarged nodes. No history of splenectomy.  PSYCHIATRIC: No history of depression or anxiety.  ENDOCRINOLOGIC: No reports of sweating, cold or heat intolerance. No polyuria or polydipsia.  Marland Kitchen   Physical Examination Vitals:   05/05/17 0953  BP: 118/82  Pulse: 74  SpO2: 95%   Vitals:   05/05/17 0953  Weight: 160 lb (72.6 kg)  Height: 5\' 3"  (1.6 m)    Gen: resting comfortably, no acute distress HEENT: no scleral icterus, pupils equal round and reactive, no palptable cervical adenopathy,  CV: RRR, no m/r/g, no jvd Resp: Clear to auscultation bilaterally GI: abdomen is soft, non-tender, non-distended, normal bowel sounds, no hepatosplenomegaly MSK: extremities are warm, no edema.  Skin: warm, no rash Neuro:  no focal deficits Psych: appropriate affect    Assessment and Plan  1. Palpitations/Tachycardia - ongoing episodes, we will plan for a 30 day monitor to further evaliute, she would like to wait 2 weeks until after she gets back from beach -  We will start lopressor 25mg  bid prn palpitations.    F/u 2 months   Arnoldo Lenis, M.D.

## 2017-05-05 NOTE — Patient Instructions (Signed)
Medication Instructions:  Start metoprolol (lopressor) 25 mg two times daily AS NEEDED FOR PALPITATIONS    Labwork: NONE  Testing/Procedures: NONE  Follow-Up: Your physician recommends that you schedule a follow-up appointment in: 2 MONTHS    Any Other Special Instructions Will Be Listed Below (If Applicable).     If you need a refill on your cardiac medications before your next appointment, please call your pharmacy.

## 2017-06-02 ENCOUNTER — Ambulatory Visit (INDEPENDENT_AMBULATORY_CARE_PROVIDER_SITE_OTHER): Payer: BC Managed Care – PPO

## 2017-06-02 DIAGNOSIS — R002 Palpitations: Secondary | ICD-10-CM

## 2017-06-16 ENCOUNTER — Encounter: Payer: Self-pay | Admitting: Adult Health

## 2017-06-16 ENCOUNTER — Other Ambulatory Visit: Payer: Self-pay

## 2017-06-16 ENCOUNTER — Ambulatory Visit: Payer: BC Managed Care – PPO | Admitting: Adult Health

## 2017-06-16 VITALS — BP 140/86 | HR 92 | Ht 63.5 in | Wt 156.0 lb

## 2017-06-16 DIAGNOSIS — Z1212 Encounter for screening for malignant neoplasm of rectum: Secondary | ICD-10-CM | POA: Diagnosis not present

## 2017-06-16 DIAGNOSIS — R7989 Other specified abnormal findings of blood chemistry: Secondary | ICD-10-CM | POA: Insufficient documentation

## 2017-06-16 DIAGNOSIS — Z01411 Encounter for gynecological examination (general) (routine) with abnormal findings: Secondary | ICD-10-CM | POA: Diagnosis not present

## 2017-06-16 DIAGNOSIS — Z1211 Encounter for screening for malignant neoplasm of colon: Secondary | ICD-10-CM

## 2017-06-16 DIAGNOSIS — Z01419 Encounter for gynecological examination (general) (routine) without abnormal findings: Secondary | ICD-10-CM

## 2017-06-16 NOTE — Progress Notes (Signed)
Patient ID: Jane Mclean, female   DOB: [birth year], 61 y.o.   MRN: [id number] History of Present Illness: Jane Mclean is a 61 year old white female, married in for well woman gyn exam, she had a normal pap, with negative HOV 06/10/15.  PCP is Dr Mickie Hillier.    Current Medications, Allergies, Past Medical History, Past Surgical History, Family History and Social History were reviewed in [employer] record.     Review of Systems: Patient denies any headaches, hearing loss, fatigue, blurred vision, shortness of breath, chest pain, abdominal pain, problems with bowel movements, urination, or intercourse. No joint pain or mood swings. Some hot flashes since stopping patches. Has had 2 episodes of heart racing, is wearing heart monitor from Dr Harl Bowie    Physical Exam:BP 140/86 (BP Location: Right Arm, Patient Position: Sitting, Cuff Size: Normal)   Pulse 92   Ht 5' 3.5" (1.613 m)   Wt 156 lb (70.8 kg)   BMI 27.20 kg/m  General:  Well developed, well nourished, no acute distress Skin:  Warm and dry Neck:  Midline trachea, normal thyroid, good ROM, no lymphadenopathy Lungs; Clear to auscultation bilaterally Breast:  No dominant palpable mass, retraction, or nipple discharge Cardiovascular: Regular rate and rhythm Abdomen:  Soft, non tender, no hepatosplenomegaly Pelvic:  External genitalia is normal in appearance, no lesions.  The vagina is normal in appearance. Urethra has no lesions or masses. The cervix is smooth.  Uterus is felt to be normal size, shape, and contour.  No adnexal masses or tenderness noted.Bladder is non tender, no masses felt. Rectal: Good sphincter tone, no polyps, or hemorrhoids felt.  Hemoccult negative. Extremities/musculoskeletal:  No swelling or varicosities noted, no clubbing or cyanosis Psych:  No mood changes, alert and cooperative,seems happy PHQ 2 score 0  Impression: 1. Encounter for well woman exam with routine gynecological exam   2.  Screening for colorectal cancer   3.       Elevated serum creatinine     Plan: Pap and physical in 1 year Mammogram yearly  Check CMP

## 2017-06-17 ENCOUNTER — Telehealth: Payer: Self-pay | Admitting: Adult Health

## 2017-06-17 LAB — COMPREHENSIVE METABOLIC PANEL
A/G RATIO: 1.7 (ref 1.2–2.2)
ALBUMIN: 4.9 g/dL (ref 3.5–5.5)
ALK PHOS: 122 IU/L — AB (ref 39–117)
ALT: 14 IU/L (ref 0–32)
AST: 17 IU/L (ref 0–40)
BILIRUBIN TOTAL: 0.3 mg/dL (ref 0.0–1.2)
BUN/Creatinine Ratio: 14 (ref 9–23)
BUN: 13 mg/dL (ref 6–24)
CHLORIDE: 100 mmol/L (ref 96–106)
CO2: 27 mmol/L (ref 20–29)
Calcium: 9.7 mg/dL (ref 8.7–10.2)
Creatinine, Ser: 0.94 mg/dL (ref 0.57–1.00)
GFR calc Af Amer: 77 mL/min/{1.73_m2} (ref >59)
GFR calc non Af Amer: 67 mL/min/{1.73_m2} (ref >59)
GLOBULIN, TOTAL: 2.9 g/dL (ref 1.5–4.5)
Glucose: 86 mg/dL (ref 65–99)
POTASSIUM: 4.1 mmol/L (ref 3.5–5.2)
SODIUM: 142 mmol/L (ref 134–144)
Total Protein: 7.8 g/dL (ref 6.0–8.5)

## 2017-06-17 NOTE — Telephone Encounter (Signed)
Pt aware that creatinine back to normal

## 2017-07-07 ENCOUNTER — Encounter: Payer: Self-pay | Admitting: Cardiology

## 2017-07-07 ENCOUNTER — Ambulatory Visit: Payer: BC Managed Care – PPO | Admitting: Cardiology

## 2017-07-07 VITALS — BP 123/76 | HR 81 | Ht 63.5 in | Wt 162.2 lb

## 2017-07-07 DIAGNOSIS — R002 Palpitations: Secondary | ICD-10-CM

## 2017-07-07 DIAGNOSIS — R000 Tachycardia, unspecified: Secondary | ICD-10-CM

## 2017-07-07 NOTE — Patient Instructions (Signed)
Your physician recommends that you schedule a follow-up appointment in: 6 MONTHS WITH DR BRANCH  Your physician recommends that you continue on your current medications as directed. Please refer to the Current Medication list given to you today.  Thank you for choosing [facility]!!    

## 2017-07-07 NOTE — Progress Notes (Signed)
Clinical Summary Jane Mclean is a 61 y.o.female seen today for follow up of the following medical problems.   1. Palpitations - ER visit 01/02/17 with symptoms. K 3.7, TSH 2.1, trop neg. EKG sinus tachycardia.     - since last visit completed heart monitor which showed PACs with short runs of atach, few PVCs. - no recurrent symptoms .    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    Past Medical History:  Diagnosis Date  . Colon cancer ([place])   . FH: migraines   . Hypothyroid   . Irregular heart beat   . Postmenopausal HRT (hormone replacement therapy) 05/21/2013  . Rosacea   . Vocal cord atrophy      Allergies  Allergen Reactions  . Amoxicillin   . Ampicillin   . Hydrocodone-Acetaminophen Nausea And Vomiting  . Oxycodone   . Pyridium [Phenazopyridine Hcl] Nausea And Vomiting  . Shellfish Allergy   . Penicillins Hives and Rash    Has patient had a PCN reaction causing immediate rash, facial/tongue/throat swelling, SOB or lightheadedness with hypotension: Yes Has patient had a PCN reaction causing severe rash involving mucus membranes or skin necrosis: Yes Has patient had a PCN reaction that required hospitalization: No Has patient had a PCN reaction occurring within the last 10 years: No If all of the above answers are "NO", then may proceed with Cephalosporin use.  . Tramadol Rash     Current Outpatient Medications  Medication Sig Dispense Refill  . Azelaic Acid (FINACEA) 15 % cream Apply topically 2 (two) times daily. After skin is thoroughly washed and patted dry, gently but thoroughly massage a thin film of azelaic acid cream into the affected area twice daily, in the morning and evening.    Marland Kitchen levothyroxine (SYNTHROID, LEVOTHROID) 100 MCG tablet Take 1 tablet (100 mcg total) by mouth daily before breakfast. 90 tablet 3  . metoprolol tartrate (LOPRESSOR) 25 MG tablet Take 1 tablet (25 mg total) by mouth 2 (two) times daily as needed. (Patient not taking:  Reported on 06/16/2017) 180 tablet 3  . Multiple Vitamin (MULTIVITAMIN) tablet Take 1 tablet by mouth daily.    Marland Kitchen omeprazole (PRILOSEC) 20 MG capsule Take 20 mg by mouth daily.     No current facility-administered medications for this visit.      Past Surgical History:  Procedure Laterality Date  . CESAREAN SECTION    . CHOLECYSTECTOMY  1989  . colonectomy  2006   partial  . ENDOMETRIAL ABLATION    . TUBAL LIGATION    . VOCAL CORD INJECTION       Allergies  Allergen Reactions  . Amoxicillin   . Ampicillin   . Hydrocodone-Acetaminophen Nausea And Vomiting  . Oxycodone   . Pyridium [Phenazopyridine Hcl] Nausea And Vomiting  . Shellfish Allergy   . Penicillins Hives and Rash    Has patient had a PCN reaction causing immediate rash, facial/tongue/throat swelling, SOB or lightheadedness with hypotension: Yes Has patient had a PCN reaction causing severe rash involving mucus membranes or skin necrosis: Yes Has patient had a PCN reaction that required hospitalization: No Has patient had a PCN reaction occurring within the last 10 years: No If all of the above answers are "NO", then may proceed with Cephalosporin use.  . Tramadol Rash      Family History  Problem Relation Age of Onset  . Glaucoma Mother   . Parkinson's disease Father   . Macular degeneration Maternal Aunt   .  Hypertension Maternal Grandmother   . Stroke Maternal Grandmother   . Crohn's disease Sister   . Migraines Sister   . Other Brother        back problems  . Macular degeneration Maternal Uncle      Social History Jane Mclean reports that  has never smoked. she has never used smokeless tobacco. Jane Mclean reports that she does not drink alcohol.   Review of Systems CONSTITUTIONAL: No weight loss, fever, chills, weakness or fatigue.  HEENT: Eyes: No visual loss, blurred vision, double vision or yellow sclerae.No hearing loss, sneezing, congestion, runny nose or sore throat.  SKIN: No rash or  itching.  CARDIOVASCULAR: per hpi RESPIRATORY: No shortness of breath, cough or sputum.  GASTROINTESTINAL: No anorexia, nausea, vomiting or diarrhea. No abdominal pain or blood.  GENITOURINARY: No burning on urination, no polyuria NEUROLOGICAL: No headache, dizziness, syncope, paralysis, ataxia, numbness or tingling in the extremities. No change in bowel or bladder control.  MUSCULOSKELETAL: No muscle, back pain, joint pain or stiffness.  LYMPHATICS: No enlarged nodes. No history of splenectomy.  PSYCHIATRIC: No history of depression or anxiety.  ENDOCRINOLOGIC: No reports of sweating, cold or heat intolerance. No polyuria or polydipsia.  Marland Kitchen   Physical Examination Vitals:   07/07/17 1515  BP: 123/76  Pulse: 81   Vitals:   07/07/17 1515  Weight: 162 lb 3.2 oz (73.6 kg)  Height: 5' 3.5" (1.613 m)    Gen: resting comfortably, no acute distress HEENT: no scleral icterus, pupils equal round and reactive, no palptable cervical adenopathy,  CV: RRR, no m/r/g, no jvd Resp: Clear to auscultation bilaterally GI: abdomen is soft, non-tender, non-distended, normal bowel sounds, no hepatosplenomegaly MSK: extremities are warm, no edema.  Skin: warm, no rash Neuro:  no focal deficits Psych: appropriate affect    Assessment and Plan  1. Palpitations/Tachycardia -recent monitor with PACs, PVCs, and short run of atach - continue lopressor, can titrate further if recurrent symptoms  F/u 6 months    Arnoldo Lenis, M.D.

## 2017-07-12 ENCOUNTER — Encounter: Payer: Self-pay | Admitting: Cardiology

## 2017-09-11 ENCOUNTER — Encounter: Payer: Self-pay | Admitting: Family Medicine

## 2017-10-14 ENCOUNTER — Other Ambulatory Visit: Payer: Self-pay | Admitting: Adult Health

## 2017-10-14 DIAGNOSIS — Z1231 Encounter for screening mammogram for malignant neoplasm of breast: Secondary | ICD-10-CM

## 2017-11-16 ENCOUNTER — Ambulatory Visit (HOSPITAL_COMMUNITY): Payer: BC Managed Care – PPO

## 2017-11-17 ENCOUNTER — Ambulatory Visit (HOSPITAL_COMMUNITY)
Admission: RE | Admit: 2017-11-17 | Discharge: 2017-11-17 | Disposition: A | Discharge status: Home / Self Care | Payer: BC Managed Care – PPO | Source: Ambulatory Visit | Attending: Adult Health | Admitting: Adult Health

## 2017-11-17 DIAGNOSIS — Z1231 Encounter for screening mammogram for malignant neoplasm of breast: Secondary | ICD-10-CM | POA: Diagnosis not present

## 2018-01-10 ENCOUNTER — Ambulatory Visit: Payer: BC Managed Care – PPO | Admitting: Cardiology

## 2018-01-10 ENCOUNTER — Encounter: Payer: Self-pay | Admitting: Cardiology

## 2018-01-10 VITALS — BP 129/75 | HR 81 | Ht 63.5 in | Wt 162.6 lb

## 2018-01-10 DIAGNOSIS — R000 Tachycardia, unspecified: Secondary | ICD-10-CM | POA: Diagnosis not present

## 2018-01-10 DIAGNOSIS — R002 Palpitations: Secondary | ICD-10-CM | POA: Diagnosis not present

## 2018-01-10 MED ORDER — METOPROLOL TARTRATE 25 MG PO TABS: ORAL_TABLET | ORAL | 1 refills | Status: DC

## 2018-01-10 NOTE — Patient Instructions (Signed)
Your physician wants you to follow-up in: [place] will receive a reminder letter in the mail two months in advance. If you don't receive a letter, please call our office to schedule the follow-up appointment.  Your physician has recommended you make the following change in your medication:   CHANGE METOPROLOL 12.5 MG (1/2 TABLET) TWICE DAILY - MAY TAKE ADDITIONAL 25 MG (1 TABLET) AS NEEDED FOR PALPITATIONS   Your physician recommends that you return for lab work BMP/MG  Thank you for choosing [facility]!!

## 2018-01-10 NOTE — Progress Notes (Signed)
Clinical Summary Ms. Claar is a 62 y.o.female seen today for follow up of the following medical problems.  1. Palpitations - ER visit 01/02/17 with symptoms. K 3.7, TSH 2.1, trop neg. EKG sinus tachycardia.   - completed heart monitor which showed PACs with short runs of atach, few PVCs.  - episode May 8th while sleeping. Episode of heart racing/pounding. Took metoprolol without relief. Fitbit reported heart rates 135-145 x 1 hour, then 115 x 1 hour. Later that morning felt exchaused, diaphoretic.  - no EtoH. No heavy caffeine use. - no other episodes.   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    Past Medical History:  Diagnosis Date  . Colon cancer ([place])   . FH: migraines   . Hypothyroid   . Irregular heart beat   . Postmenopausal HRT (hormone replacement therapy) 05/21/2013  . Rosacea   . Vocal cord atrophy      Allergies  Allergen Reactions  . Amoxicillin   . Ampicillin   . Hydrocodone-Acetaminophen Nausea And Vomiting  . Oxycodone   . Pyridium [Phenazopyridine Hcl] Nausea And Vomiting  . Shellfish Allergy   . Penicillins Hives and Rash    Has patient had a PCN reaction causing immediate rash, facial/tongue/throat swelling, SOB or lightheadedness with hypotension: Yes Has patient had a PCN reaction causing severe rash involving mucus membranes or skin necrosis: Yes Has patient had a PCN reaction that required hospitalization: No Has patient had a PCN reaction occurring within the last 10 years: No If all of the above answers are "NO", then may proceed with Cephalosporin use.  . Tramadol Rash     Current Outpatient Medications  Medication Sig Dispense Refill  . Azelaic Acid (FINACEA) 15 % cream Apply topically 2 (two) times daily. After skin is thoroughly washed and patted dry, gently but thoroughly massage a thin film of azelaic acid cream into the affected area twice daily, in the morning and evening.    Marland Kitchen levothyroxine (SYNTHROID, LEVOTHROID) 100  MCG tablet Take 1 tablet (100 mcg total) by mouth daily before breakfast. 90 tablet 3  . metoprolol tartrate (LOPRESSOR) 25 MG tablet Take 1 tablet (25 mg total) by mouth 2 (two) times daily as needed. 180 tablet 3  . Multiple Vitamin (MULTIVITAMIN) tablet Take 1 tablet by mouth daily.    Marland Kitchen omeprazole (PRILOSEC) 20 MG capsule Take 20 mg by mouth daily.     No current facility-administered medications for this visit.      Past Surgical History:  Procedure Laterality Date  . CESAREAN SECTION    . CHOLECYSTECTOMY  1989  . colonectomy  2006   partial  . ENDOMETRIAL ABLATION    . TUBAL LIGATION    . VOCAL CORD INJECTION       Allergies  Allergen Reactions  . Amoxicillin   . Ampicillin   . Hydrocodone-Acetaminophen Nausea And Vomiting  . Oxycodone   . Pyridium [Phenazopyridine Hcl] Nausea And Vomiting  . Shellfish Allergy   . Penicillins Hives and Rash    Has patient had a PCN reaction causing immediate rash, facial/tongue/throat swelling, SOB or lightheadedness with hypotension: Yes Has patient had a PCN reaction causing severe rash involving mucus membranes or skin necrosis: Yes Has patient had a PCN reaction that required hospitalization: No Has patient had a PCN reaction occurring within the last 10 years: No If all of the above answers are "NO", then may proceed with Cephalosporin use.  . Tramadol Rash  Family History  Problem Relation Age of Onset  . Glaucoma Mother   . Parkinson's disease Father   . Macular degeneration Maternal Aunt   . Hypertension Maternal Grandmother   . Stroke Maternal Grandmother   . Crohn's disease Sister   . Migraines Sister   . Other Brother        back problems  . Macular degeneration Maternal Uncle      Social History Ms. Vos reports that she has never smoked. She has never used smokeless tobacco. Ms. Dolson reports that she does not drink alcohol.   Review of Systems CONSTITUTIONAL: No weight loss, fever, chills,  weakness or fatigue.  HEENT: Eyes: No visual loss, blurred vision, double vision or yellow sclerae.No hearing loss, sneezing, congestion, runny nose or sore throat.  SKIN: No rash or itching.  CARDIOVASCULAR: per hpi RESPIRATORY: No shortness of breath, cough or sputum.  GASTROINTESTINAL: No anorexia, nausea, vomiting or diarrhea. No abdominal pain or blood.  GENITOURINARY: No burning on urination, no polyuria NEUROLOGICAL: No headache, dizziness, syncope, paralysis, ataxia, numbness or tingling in the extremities. No change in bowel or bladder control.  MUSCULOSKELETAL: No muscle, back pain, joint pain or stiffness.  LYMPHATICS: No enlarged nodes. No history of splenectomy.  PSYCHIATRIC: No history of depression or anxiety.  ENDOCRINOLOGIC: No reports of sweating, cold or heat intolerance. No polyuria or polydipsia.  Marland Kitchen   Physical Examination Vitals:   01/10/18 1449  BP: 129/75  Pulse: 81  SpO2: 99%   Vitals:   01/10/18 1449  Weight: 162 lb 9.6 oz (73.8 kg)  Height: 5' 3.5" (1.613 m)    Gen: resting comfortably, no acute distress HEENT: no scleral icterus, pupils equal round and reactive, no palptable cervical adenopathy,  CV: RRR, no m/r/g, no jvd Resp: Clear to auscultation bilaterally GI: abdomen is soft, non-tender, non-distended, normal bowel sounds, no hepatosplenomegaly MSK: extremities are warm, no edema.  Skin: warm, no rash Neuro:  no focal deficits Psych: appropriate affect     Assessment and Plan  1. Palpitations/Tachycardia -recent monitor with PACs, PVCs, and short run of atach - recent severe episode of palpitations - EKG today shows normal sinus rhythm  We will start lopressor 12.5mg  bid, can still take additional 25mg  prn  - check BMET/Mg for her palpitations.  F/u 6 months.        Arnoldo Lenis, M.D.

## 2018-01-16 ENCOUNTER — Encounter: Payer: Self-pay | Admitting: Cardiology

## 2018-01-31 ENCOUNTER — Other Ambulatory Visit: Payer: Self-pay | Admitting: Cardiology

## 2018-01-31 LAB — TSH: TSH: 1.43 mIU/L (ref 0.40–4.50)

## 2018-01-31 LAB — MAGNESIUM: MAGNESIUM: 2.2 mg/dL (ref 1.5–2.5)

## 2018-01-31 LAB — BASIC METABOLIC PANEL WITH GFR
BUN: 16 mg/dL (ref 7–25)
CALCIUM: 9 mg/dL (ref 8.6–10.4)
CHLORIDE: 105 mmol/L (ref 98–110)
CO2: 29 mmol/L (ref 20–32)
Creat: 0.94 mg/dL (ref 0.50–0.99)
GFR, EST AFRICAN AMERICAN: 76 mL/min/{1.73_m2} (ref >=60)
GFR, Est Non African American: 66 mL/min/{1.73_m2} (ref >=60)
Glucose, Bld: 95 mg/dL (ref 65–99)
POTASSIUM: 4.3 mmol/L (ref 3.5–5.3)
Sodium: 140 mmol/L (ref 135–146)

## 2018-02-08 ENCOUNTER — Telehealth: Payer: Self-pay | Admitting: *Deleted

## 2018-02-08 NOTE — Telephone Encounter (Signed)
-----   Message from Arnoldo Lenis, MD sent at 02/07/2018  2:50 PM EDT ----- Labs look good  Zandra Abts MD

## 2018-02-08 NOTE — Telephone Encounter (Signed)
Pt aware - routed to pcp  

## 2018-02-09 ENCOUNTER — Ambulatory Visit: Payer: BC Managed Care – PPO | Admitting: Family Medicine

## 2018-02-09 ENCOUNTER — Encounter: Payer: Self-pay | Admitting: Family Medicine

## 2018-02-09 VITALS — BP 120/76 | Ht 63.5 in | Wt 161.6 lb

## 2018-02-09 DIAGNOSIS — R000 Tachycardia, unspecified: Secondary | ICD-10-CM | POA: Diagnosis not present

## 2018-02-09 DIAGNOSIS — E039 Hypothyroidism, unspecified: Secondary | ICD-10-CM

## 2018-02-09 MED ORDER — SCOPOLAMINE 1 MG/3DAYS TD PT72: 1.0000 | MEDICATED_PATCH | TRANSDERMAL | 0 refills | Status: DC

## 2018-02-09 MED ORDER — LEVOTHYROXINE SODIUM 100 MCG PO TABS: 100.0000 ug | ORAL_TABLET | Freq: Every day | ORAL | 3 refills | Status: DC

## 2018-02-09 NOTE — Progress Notes (Signed)
   Subjective:    Patient ID: Jane Mclean, female    DOB: 10/17/1955, 62 y.o.   MRN: [id number]  HPI  Patient arrives for a follow up on thyroid. Patient would also like a prescription for patches for an upcoming cruise.  stil occas her tskiping ont he metoprolol  Thyroid compliant with the med  Pt notes rasoy voice and heart palpitaions and onder ed about   Thru nodues   exercis doing a lot of activit y    Voice progressively rasp  teogh had injection of fat into the vocal chords, and no thery are saying that is more tonal. Pt had voice therapy  And it really didn't help   Testing patient is working under a couple of misconceptions currently.  In her research she found it on occasion hoarseness can be associate with thyroid nodules.  Had a rather thorough evaluation and intervention regarding hoarseness.  First saw the ENT.  Then went on to the academic ENTs.  Patient did have some vocal issues.  Interventions were attempted.  Patient states really has not helped much at all.  Also notes that her TSH is low and could potentially be a source of her ongoing fatigue.  Patient also notes palpitations.  Intermittent in nature.  Saw the cardiologist.  Was felt to be benign in nature after an appropriate work-up  Pt saw the allergist and they felt it was not a componetn    womens helath taking v      Review of Systems No headache, no major weight loss or weight gain, no chest pain no back pain abdominal pain no change in bowel habits complete ROS otherwise negative     Objective:   Physical Exam  Alert and oriented, vitals reviewed and stable, NAD ENT-TM's and ext canals WNL bilat via otoscopic exam Soft palate, tonsils and post pharynx WNL via oropharyngeal exam Neck-symmetric, no masses; thyroid nonpalpable and nontender Pulmonary-no tachypnea or accessory muscle use; Clear without wheezes via auscultation Card--no abnrml murmurs, rhythm reg and rate WNL Carotid pulses  symmetric, without bruits        Assessment & Plan:  Impression hypothyroidism excellent control.1 obviously good news although not an explanation patient's hypotheses.  Explained to her that with the most TSH thyroid is excellent therefore not a course of fatigue  2.  The coalescence of hoarseness with palpitation.  Discussed with various physical mechanisms where this can be explanatory of via a thyroid nodule.  This is not thick.  Patient has intrinsic vocal cord abnormalities along with separate back rhythm issues apart from any mechanical source regarding potential thyroid nodules.  With total normal thyroid exam would not recommend major work-up rationale discussed  Medication refill.  Scopolamine patches for upcoming trip  Greater than 50% of this 25 minute face to face visit was spent in counseling and discussion and coordination of care regarding the above diagnosis/diagnosies

## 2018-04-06 ENCOUNTER — Other Ambulatory Visit: Payer: Self-pay | Admitting: Family Medicine

## 2018-04-17 ENCOUNTER — Ambulatory Visit: Payer: BC Managed Care – PPO | Admitting: Family Medicine

## 2018-04-17 ENCOUNTER — Encounter: Payer: Self-pay | Admitting: Family Medicine

## 2018-04-17 VITALS — BP 128/88 | Temp 98.0°F | Ht 63.5 in | Wt 158.4 lb

## 2018-04-17 DIAGNOSIS — B9689 Other specified bacterial agents as the cause of diseases classified elsewhere: Secondary | ICD-10-CM

## 2018-04-17 DIAGNOSIS — J029 Acute pharyngitis, unspecified: Secondary | ICD-10-CM

## 2018-04-17 DIAGNOSIS — J019 Acute sinusitis, unspecified: Secondary | ICD-10-CM

## 2018-04-17 LAB — POCT RAPID STREP A (OFFICE): Rapid Strep A Screen: NEGATIVE

## 2018-04-17 MED ORDER — CEFDINIR 300 MG PO CAPS: 300.0000 mg | ORAL_CAPSULE | Freq: Two times a day (BID) | ORAL | 0 refills | Status: DC

## 2018-04-17 NOTE — Progress Notes (Signed)
   Subjective:    Patient ID: Jane Mclean, female    DOB: 04/11/1956, 62 y.o.   MRN: [id number]  Sore Throat   This is a new problem. The current episode started in the past 7 days. Associated symptoms include coughing and headaches. Associated symptoms comments: Runny nose. Treatments tried: nyquill. The treatment provided mild relief.   Results for orders placed or performed in visit on 04/17/18  POCT rapid strep A  Result Value Ref Range   Rapid Strep A Screen Negative Negative   Expose to a lot of pathogens the past week  Results for orders placed or performed in visit on 04/17/18  POCT rapid strep A  Result Value Ref Range   Rapid Strep A Screen Negative Negative     Sire throat  Using nyqquil  moder ate gen head ache   No major cough from the   Pos trickle  Energy level   Feel sick  No fever  Review of Systems  Respiratory: Positive for cough.   Neurological: Positive for headaches.       Objective:   Physical Exam  Alert, mild malaise. Hydration good Vitals stable.  Significant nasal congestion. pharynx blisterlike erythema otherwise normal neck supple  lungs clear/no crackles or wheezes. heart regular in rhythm       Assessment & Plan:  Impression rhinosinusitis likely post viral, discussed with patient. plan antibiotics prescribed. Questions answered. Symptomatic care discussed. warning signs discussed. WSL

## 2018-04-18 LAB — SPECIMEN STATUS REPORT

## 2018-04-18 LAB — STREP A DNA PROBE: STREP GP A DIRECT, DNA PROBE: NEGATIVE

## 2018-04-21 ENCOUNTER — Encounter: Payer: Self-pay | Admitting: Family Medicine

## 2018-04-21 MED ORDER — LEVOTHYROXINE SODIUM 100 MCG PO TABS: 100.0000 ug | ORAL_TABLET | Freq: Every day | ORAL | 3 refills | Status: DC

## 2018-08-15 ENCOUNTER — Ambulatory Visit (INDEPENDENT_AMBULATORY_CARE_PROVIDER_SITE_OTHER): Payer: BC Managed Care – PPO | Admitting: Adult Health

## 2018-08-15 ENCOUNTER — Other Ambulatory Visit (HOSPITAL_COMMUNITY)
Admission: RE | Admit: 2018-08-15 | Discharge: 2018-08-15 | Disposition: A | Discharge status: Home / Self Care | Payer: BC Managed Care – PPO | Source: Ambulatory Visit | Attending: Adult Health | Admitting: Adult Health

## 2018-08-15 ENCOUNTER — Encounter: Payer: Self-pay | Admitting: Adult Health

## 2018-08-15 VITALS — BP 129/80 | HR 67 | Ht 63.2 in | Wt 165.0 lb

## 2018-08-15 DIAGNOSIS — Z1212 Encounter for screening for malignant neoplasm of rectum: Secondary | ICD-10-CM

## 2018-08-15 DIAGNOSIS — Z1211 Encounter for screening for malignant neoplasm of colon: Secondary | ICD-10-CM

## 2018-08-15 DIAGNOSIS — Z01419 Encounter for gynecological examination (general) (routine) without abnormal findings: Secondary | ICD-10-CM | POA: Diagnosis present

## 2018-08-15 LAB — HEMOCCULT GUIAC POC 1CARD (OFFICE): Fecal Occult Blood, POC: NEGATIVE

## 2018-08-15 NOTE — Addendum Note (Signed)
Addended by: Diona Fanti A on: 08/15/2018 01:12 PM   Modules accepted: Orders

## 2018-08-15 NOTE — Progress Notes (Signed)
Patient ID: Jane Mclean, female   DOB: 07/10/1956, 63 y.o.   MRN: [id number] History of Present Illness:  Jane Mclean is a 63 year old white female, married, in for a well woman gyn exam and pap. PCP is Mickie Hillier.   Current Medications, Allergies, Past Medical History, Past Surgical History, Family History and Social History were reviewed in [employer] record.     Review of Systems: Patient denies any headaches, hearing loss, fatigue, blurred vision, shortness of breath, chest pain, abdominal pain, problems with bowel movements, urination, or intercourse(not having sex). No joint pain or mood swings.    Physical Exam:BP 129/80 (BP Location: Left Arm, Patient Position: Sitting, Cuff Size: Normal)   Pulse 67   Ht 5' 3.2" (1.605 m)   Wt 165 lb (74.8 kg)   BMI 29.04 kg/m  General:  Well developed, well nourished, no acute distress Skin:  Warm and dry Neck:  Midline trachea, normal thyroid, good ROM, no lymphadenopathy,no carotid bruits heard Lungs; Clear to auscultation bilaterally Breast:  No dominant palpable mass, retraction, or nipple discharge Cardiovascular: Regular rate and rhythm Abdomen:  Soft, non tender, no hepatosplenomegaly Pelvic:  External genitalia is normal in appearance, no lesions.  The vagina is normal in appearance, for age with loss of color, moisture and rugae. Urethra has no lesions or masses. The cervix is smooth, pap with HPV performed.  Uterus is felt to be normal size, shape, and contour.  No adnexal masses or tenderness noted.Bladder is non tender, no masses felt. Rectal: Good sphincter tone, no polyps, or hemorrhoids felt.  Hemoccult negative. Extremities/musculoskeletal:  No swelling or varicosities noted, no clubbing or cyanosis Psych:  No mood changes, alert and cooperative,seems happy PHQ 2 score 0. Fall risk is low. Examination chaperoned by Diona Fanti CMA.  Impression: 1. Encounter for gynecological examination with  Papanicolaou smear of cervix   2. Screening for colorectal cancer       Plan: Physical in 1 year Pap in 3 if normal Mammogram yearly Labs with PCP Colonoscopy per GI

## 2018-08-16 LAB — CYTOLOGY - PAP
Diagnosis: NEGATIVE
HPV (WINDOPATH): NOT DETECTED

## 2018-09-02 HISTORY — PX: CATARACT EXTRACTION: SUR2

## 2018-10-04 ENCOUNTER — Other Ambulatory Visit (HOSPITAL_COMMUNITY): Payer: Self-pay | Admitting: Adult Health

## 2018-10-04 DIAGNOSIS — Z1231 Encounter for screening mammogram for malignant neoplasm of breast: Secondary | ICD-10-CM

## 2018-10-27 NOTE — Telephone Encounter (Signed)
There is a medication that has similar benefits to metoprolol that may be better tolerated. She can set the metoprolol aside for now (would not throw away just yet) and we can try diltiazem 30mg  bid, can take additional 30mg  as needed for palpitations. Keep [place] updated in about 2 weeks about how this medication is working for her   Zandra Abts MD

## 2018-10-31 ENCOUNTER — Ambulatory Visit: Payer: BC Managed Care – PPO | Admitting: Cardiology

## 2018-11-22 ENCOUNTER — Ambulatory Visit (HOSPITAL_COMMUNITY): Payer: BC Managed Care – PPO

## 2018-12-13 ENCOUNTER — Ambulatory Visit (HOSPITAL_COMMUNITY)
Admission: RE | Admit: 2018-12-13 | Discharge: 2018-12-13 | Disposition: A | Discharge status: Home / Self Care | Payer: BC Managed Care – PPO | Source: Ambulatory Visit | Attending: Adult Health | Admitting: Adult Health

## 2018-12-13 ENCOUNTER — Other Ambulatory Visit: Payer: Self-pay

## 2018-12-13 DIAGNOSIS — Z1231 Encounter for screening mammogram for malignant neoplasm of breast: Secondary | ICD-10-CM | POA: Diagnosis present

## 2019-01-27 ENCOUNTER — Other Ambulatory Visit: Payer: Self-pay | Admitting: Cardiology

## 2019-02-01 ENCOUNTER — Ambulatory Visit: Payer: BC Managed Care – PPO | Admitting: Cardiology

## 2019-02-07 ENCOUNTER — Ambulatory Visit (INDEPENDENT_AMBULATORY_CARE_PROVIDER_SITE_OTHER): Payer: BC Managed Care – PPO | Admitting: Family Medicine

## 2019-02-07 ENCOUNTER — Other Ambulatory Visit: Payer: Self-pay

## 2019-02-07 ENCOUNTER — Encounter: Payer: Self-pay | Admitting: Family Medicine

## 2019-02-07 DIAGNOSIS — Z13220 Encounter for screening for lipoid disorders: Secondary | ICD-10-CM | POA: Diagnosis not present

## 2019-02-07 DIAGNOSIS — E039 Hypothyroidism, unspecified: Secondary | ICD-10-CM | POA: Diagnosis not present

## 2019-02-07 DIAGNOSIS — Z79899 Other long term (current) drug therapy: Secondary | ICD-10-CM | POA: Diagnosis not present

## 2019-02-07 NOTE — Progress Notes (Signed)
   Subjective:    Patient ID: Jane Mclean, female    DOB: 01/06/1956, 63 y.o.   MRN: [id number] Audio only HPI  Patient calls for a follow up on thyroid and palpitations. Patient states she has had no issues with palpitations since medication.  Virtual Visit via Video Note  I connected with Jane Mclean on 02/07/19 at  2:00 PM EDT by a video enabled telemedicine application and verified that I am speaking with the correct person using two identifiers.  Location: Patient: home Provider: office   I discussed the limitations of evaluation and management by telemedicine and the availability of in person appointments. The patient expressed understanding and agreed to proceed.  History of Present Illness:    Observations/Objective:   Assessment and Plan:   Follow Up Instructions:    I discussed the assessment and treatment plan with the patient. The patient was provided an opportunity to ask questions and all were answered. The patient agreed with the plan and demonstrated an understanding of the instructions.   The patient was advised to call back or seek an in-person evaluation if the symptoms worsen or if the condition fails to improve as anticipated.  I provided 18 minutes of non-face-to-face time during this encounter.  No major symptoms of high or low thyroid.  Compliant with thyroid medicine.  Has not had blood work per year.  On medication for palpitations.    Review of Systems No headache, no major weight loss or weight gain, no chest pain no back pain abdominal pain no change in bowel habits complete ROS otherwise negative     Objective:   Physical Exam  Virtual      Assessment & Plan:  Impression hypothyroidism status uncertain will check blood work patient asked further screening blood work.  Diet exercise discussed await blood work results  2.  Palpitations followed by cardiologist

## 2019-02-10 ENCOUNTER — Encounter: Payer: Self-pay | Admitting: Family Medicine

## 2019-02-15 LAB — LIPID PANEL
Chol/HDL Ratio: 3.5 ratio (ref 0.0–4.4)
Cholesterol, Total: 180 mg/dL (ref 100–199)
HDL: 52 mg/dL (ref >39)
LDL Calculated: 107 mg/dL — ABNORMAL HIGH (ref 0–99)
Triglycerides: 106 mg/dL (ref 0–149)
VLDL Cholesterol Cal: 21 mg/dL (ref 5–40)

## 2019-02-15 LAB — BASIC METABOLIC PANEL
BUN/Creatinine Ratio: 15 (ref 12–28)
BUN: 15 mg/dL (ref 8–27)
CO2: 25 mmol/L (ref 20–29)
Calcium: 9.5 mg/dL (ref 8.7–10.3)
Chloride: 105 mmol/L (ref 96–106)
Creatinine, Ser: 0.97 mg/dL (ref 0.57–1.00)
GFR calc Af Amer: 73 mL/min/{1.73_m2} (ref >59)
GFR calc non Af Amer: 63 mL/min/{1.73_m2} (ref >59)
Glucose: 91 mg/dL (ref 65–99)
Potassium: 4.6 mmol/L (ref 3.5–5.2)
Sodium: 144 mmol/L (ref 134–144)

## 2019-02-15 LAB — HEPATIC FUNCTION PANEL
ALT: 13 IU/L (ref 0–32)
AST: 11 IU/L (ref 0–40)
Albumin: 4.3 g/dL (ref 3.8–4.8)
Alkaline Phosphatase: 109 IU/L (ref 39–117)
Bilirubin Total: 0.3 mg/dL (ref 0.0–1.2)
Bilirubin, Direct: 0.09 mg/dL (ref 0.00–0.40)
Total Protein: 7 g/dL (ref 6.0–8.5)

## 2019-02-15 LAB — TSH: TSH: 2.1 u[IU]/mL (ref 0.450–4.500)

## 2019-02-19 ENCOUNTER — Encounter: Payer: Self-pay | Admitting: Family Medicine

## 2019-03-02 ENCOUNTER — Telehealth: Payer: Self-pay | Admitting: Cardiology

## 2019-03-02 NOTE — Telephone Encounter (Signed)

## 2019-03-08 ENCOUNTER — Other Ambulatory Visit: Payer: Self-pay

## 2019-03-08 ENCOUNTER — Telehealth (INDEPENDENT_AMBULATORY_CARE_PROVIDER_SITE_OTHER): Payer: BC Managed Care – PPO | Admitting: Cardiology

## 2019-03-08 ENCOUNTER — Encounter: Payer: Self-pay | Admitting: Cardiology

## 2019-03-08 VITALS — Ht 63.0 in | Wt 160.0 lb

## 2019-03-08 DIAGNOSIS — R002 Palpitations: Secondary | ICD-10-CM

## 2019-03-08 DIAGNOSIS — R000 Tachycardia, unspecified: Secondary | ICD-10-CM

## 2019-03-08 MED ORDER — DILTIAZEM HCL 30 MG PO TABS: ORAL_TABLET | ORAL | 3 refills | Status: DC

## 2019-03-08 NOTE — Patient Instructions (Signed)
Medication Instructions:  Stop lopressor  START DILTIAZEM 30 MG - TWO TIMES DAILY, YOU MAY TAKE AN EXTRA 30 MG DAILY AS NEEDED FOR PALPITATIONS  Labwork: NONE  Testing/Procedures: NONE  Follow-Up: Your physician wants you to follow-up in: 1 YEAR.  You will receive a reminder letter in the mail two months in advance. If you don't receive a letter, please call our office to schedule the follow-up appointment.   Any Other Special Instructions Will Be Listed Below (If Applicable).     If you need a refill on your cardiac medications before your next appointment, please call your pharmacy.

## 2019-03-08 NOTE — Addendum Note (Signed)
Addended by: Debbora Lacrosse R on: 03/08/2019 01:59 PM   Modules accepted: Orders

## 2019-03-08 NOTE — Progress Notes (Signed)
Virtual Visit via Video Note   This visit type was conducted due to national recommendations for restrictions regarding the COVID-19 Pandemic (e.g. social distancing) in an effort to limit this patient's exposure and mitigate transmission in our community.  Due to her co-morbid illnesses, this patient is at least at moderate risk for complications without adequate follow up.  This format is felt to be most appropriate for this patient at this time.  All issues noted in this document were discussed and addressed.  A limited physical exam was performed with this format.  Please refer to the patient's chart for her consent to telehealth for [facility].   Date:  03/08/2019   ID:  Jane Mclean, DOB 04/30/1956, MRN [id number]  Patient Location: Home Provider Location: Office  PCP:  Mikey Kirschner, MD  Cardiologist:  Dr Carlyle Dolly  Electrophysiologist:  None   Evaluation Performed:  Follow-Up Visit  Chief Complaint:  Follow up  History of Present Illness:    Jane Mclean is a 63 y.o. female seen today for follow up of the following medical problems.  1. Palpitations - ER visit 01/02/17 with symptoms. K 3.7, TSH 2.1, trop neg. EKG sinus tachycardia.   - completed heart monitor which showed PACs with short runs of atach, few PVCs.  - episode May 8th while sleeping. Episode of heart racing/pounding. Took metoprolol without relief. Fitbit reported heart rates 135-145 x 1 hour, then 115 x 1 hour. Later that morning felt exchaused, diaphoretic.  - no EtoH. No heavy caffeine use. - no other episodes.   - no significant episodes recently. Takes lopressor 12.5mg  bid. Feels like taking the medication causes some heart burn.         SH: works as Clinical cytogeneticist (accounting)at high school. Part of COVID19 study at [facility].   The patient does not have symptoms concerning for COVID-19 infection (fever, chills, cough, or new shortness of breath).    Past Medical  History:  Diagnosis Date  . Colon cancer ([place])   . FH: migraines   . Hypothyroid   . Irregular heart beat   . Postmenopausal HRT (hormone replacement therapy) 05/21/2013  . Rosacea   . Vocal cord atrophy    Past Surgical History:  Procedure Laterality Date  . CESAREAN SECTION    . CHOLECYSTECTOMY  1989  . colonectomy  2006   partial  . ENDOMETRIAL ABLATION    . TUBAL LIGATION    . VOCAL CORD INJECTION       No outpatient medications have been marked as taking for the 03/08/19 encounter (Appointment) with Arnoldo Lenis, MD.     Allergies:   Amoxicillin, Ampicillin, Hydrocodone-acetaminophen, Oxycodone, Pyridium [phenazopyridine hcl], Shellfish allergy, Penicillins, and Tramadol   Social History   Tobacco Use  . Smoking status: Never Smoker  . Smokeless tobacco: Never Used  Substance Use Topics  . Alcohol use: No  . Drug use: No     Family Hx: The patient's family history includes Crohn's disease in her sister; Glaucoma in her mother; Hypertension in her maternal grandmother; Macular degeneration in her maternal aunt and maternal uncle; Migraines in her daughter and sister; Other in her brother; Parkinson's disease in her father; Stroke in her maternal grandmother.  ROS:   Please see the history of present illness.     All other systems reviewed and are negative.   Prior CV studies:   The following studies were reviewed today  Labs/Other Tests and Data Reviewed:  EKG:  No ECG reviewed.  Recent Labs: 02/14/2019: ALT 13; BUN 15; Creatinine, Ser 0.97; Potassium 4.6; Sodium 144; TSH 2.100   Recent Lipid Panel Lab Results  Component Value Date/Time   CHOL 180 02/14/2019 08:36 AM   TRIG 106 02/14/2019 08:36 AM   HDL 52 02/14/2019 08:36 AM   CHOLHDL 3.5 02/14/2019 08:36 AM   CHOLHDL 2.4 04/03/2013 07:21 AM   LDLCALC 107 (H) 02/14/2019 08:36 AM    Wt Readings from Last 3 Encounters:  08/15/18 165 lb (74.8 kg)  04/17/18 158 lb 6.4 oz (71.8 kg)  02/09/18  161 lb 9.6 oz (73.3 kg)     Objective:    Vital Signs:   Today's Vitals   03/08/19 1301  Weight: 160 lb (72.6 kg)  Height: 5\' 3"  (1.6 m)   Body mass index is 28.34 kg/m.  Normal affect. Normal speech pattern and tone. Comfortable, no apparent distress. No audible signs of SOB or wheezing.   ASSESSMENT & PLAN:    1. Palpitations/Tachycardia -recent monitor with PACs, PVCs, and short run of atach - has done well on lopressor but reports episodes of heart burn she attributes to the medication, from review up to 1% of patients can have this side effect - stop lopressor, start dilt 30mg  bid may take additional 30mg  as needed prn.    F/u 1 year  COVID-19 Education: The signs and symptoms of COVID-19 were discussed with the patient and how to seek care for testing (follow up with PCP or arrange E-visit).  The importance of social distancing was discussed today.  Time:   Today, I have spent 15 minutes with the patient with telehealth technology discussing the above problems.     Medication Adjustments/Labs and Tests Ordered: Current medicines are reviewed at length with the patient today.  Concerns regarding medicines are outlined above.   Tests Ordered: No orders of the defined types were placed in this encounter.   Medication Changes: No orders of the defined types were placed in this encounter.   Follow Up:  In Person in 1 year(s)  Signed, Carlyle Dolly, MD  03/08/2019 12:38 PM    [place]

## 2019-03-20 ENCOUNTER — Telehealth: Payer: Self-pay | Admitting: Family Medicine

## 2019-03-20 ENCOUNTER — Other Ambulatory Visit: Payer: Self-pay | Admitting: *Deleted

## 2019-03-20 MED ORDER — LEVOTHYROXINE SODIUM 100 MCG PO TABS: 100.0000 ug | ORAL_TABLET | Freq: Every day | ORAL | 3 refills | Status: DC

## 2019-03-20 NOTE — Telephone Encounter (Signed)
Our protocol is for 6 months and last year pt got one year of refills. Do you want to do one year this time also?

## 2019-03-20 NOTE — Telephone Encounter (Signed)
Pt needs refills for her levothyroxine (SYNTHROID, LEVOTHROID) 100 MCG tablet  Pt had virtual visit in July & this refill was never sent in  Please advise & call pt when done        [facility]

## 2019-03-20 NOTE — Telephone Encounter (Signed)
Pt returned call and made aware one year has been sent to pharmacy

## 2019-03-20 NOTE — Telephone Encounter (Signed)
Left message to return call. Refills sent for one year

## 2019-03-20 NOTE — Telephone Encounter (Signed)
One yr

## 2019-08-17 ENCOUNTER — Other Ambulatory Visit: Payer: Self-pay

## 2019-08-17 DIAGNOSIS — Z20822 Contact with and (suspected) exposure to covid-19: Secondary | ICD-10-CM

## 2019-08-18 LAB — NOVEL CORONAVIRUS, NAA: SARS-CoV-2, NAA: NOT DETECTED

## 2019-09-06 ENCOUNTER — Encounter: Payer: Self-pay | Admitting: Family Medicine

## 2019-09-11 ENCOUNTER — Other Ambulatory Visit: Payer: Self-pay

## 2019-09-11 ENCOUNTER — Encounter: Payer: Self-pay | Admitting: Family Medicine

## 2019-09-11 ENCOUNTER — Ambulatory Visit: Payer: BC Managed Care – PPO | Admitting: Family Medicine

## 2019-09-11 VITALS — BP 138/90 | Temp 96.9°F | Wt 162.2 lb

## 2019-09-11 DIAGNOSIS — M7062 Trochanteric bursitis, left hip: Secondary | ICD-10-CM

## 2019-09-11 NOTE — Patient Instructions (Signed)
Hip Exercises Ask your health care provider which exercises are safe for you. Do exercises exactly as told by your health care provider and adjust them as directed. It is normal to feel mild stretching, pulling, tightness, or discomfort as you do these exercises. Stop right away if you feel sudden pain or your pain gets worse. Do not begin these exercises until told by your health care provider. Stretching and range-of-motion exercises These exercises warm up your muscles and joints and improve the movement and flexibility of your hip. These exercises also help to relieve pain, numbness, and tingling. You may be asked to limit your range of motion if you had a hip replacement. Talk to your health care provider about these restrictions. Hamstrings, supine  1. Lie on your back (supine position). 2. Loop a belt or towel over the ball of your left / right foot. The ball of your foot is on the walking surface, right under your toes. 3. Straighten your left / right knee and slowly pull on the belt or towel to raise your leg until you feel a gentle stretch behind your knee (hamstring). ? Do not let your knee bend while you do this. ? Keep your other leg flat on the floor. 4. Hold this position for __________ seconds. 5. Slowly return your leg to the starting position. Repeat __________ times. Complete this exercise __________ times a day. Hip rotation  1. Lie on your back on a firm surface. 2. With your left / right hand, gently pull your left / right knee toward the shoulder that is on the same side of the body. Stop when your knee is pointing toward the ceiling. 3. Hold your left / right ankle with your other hand. 4. Keeping your knee steady, gently pull your left / right ankle toward your other shoulder until you feel a stretch in your buttocks. ? Keep your hips and shoulders firmly planted while you do this stretch. 5. Hold this position for __________ seconds. Repeat __________ times. Complete  this exercise __________ times a day. Seated stretch This exercise is sometimes called hamstrings and adductors stretch. 1. Sit on the floor with your legs stretched wide. Keep your knees straight during this exercise. 2. Keeping your head and back in a straight line, bend at your waist to reach for your left foot (position A). You should feel a stretch in your right inner thigh (adductors). 3. Hold this position for __________ seconds. Then slowly return to the upright position. 4. Keeping your head and back in a straight line, bend at your waist to reach forward (position B). You should feel a stretch behind both of your thighs and knees (hamstrings). 5. Hold this position for __________ seconds. Then slowly return to the upright position. 6. Keeping your head and back in a straight line, bend at your waist to reach for your right foot (position C). You should feel a stretch in your left inner thigh (adductors). 7. Hold this position for __________ seconds. Then slowly return to the upright position. Repeat __________ times. Complete this exercise __________ times a day. Lunge This exercise stretches the muscles of the hip (hip flexors). 1. Place your left / right knee on the floor and bend your other knee so that is directly over your ankle. You should be half-kneeling. 2. Keep good posture with your head over your shoulders. 3. Tighten your buttocks to point your tailbone downward. This will prevent your back from arching too much. 4. You should feel a  gentle stretch in the front of your left / right thigh and hip. If you do not feel a stretch, slide your other foot forward slightly and then slowly lunge forward with your chest up until your knee once again lines up over your ankle. ? Make sure your tailbone continues to point downward. 5. Hold this position for __________ seconds. 6. Slowly return to the starting position. Repeat __________ times. Complete this exercise __________ times a  day. Strengthening exercises These exercises build strength and endurance in your hip. Endurance is the ability to use your muscles for a long time, even after they get tired. Bridge This exercise strengthens the muscles of your hip (hip extensors). 1. Lie on your back on a firm surface with your knees bent and your feet flat on the floor. 2. Tighten your buttocks muscles and lift your bottom off the floor until the trunk of your body and your hips are level with your thighs. ? Do not arch your back. ? You should feel the muscles working in your buttocks and the back of your thighs. If you do not feel these muscles, slide your feet 1-2 inches (2.5-5 cm) farther away from your buttocks. 3. Hold this position for __________ seconds. 4. Slowly lower your hips to the starting position. 5. Let your muscles relax completely between repetitions. Repeat __________ times. Complete this exercise __________ times a day. Straight leg raises, side-lying This exercise strengthens the muscles that move the hip joint away from the center of the body (hip abductors). 1. Lie on your side with your left / right leg in the top position. Lie so your head, shoulder, hip, and knee line up. You may bend your bottom knee slightly to help you balance. 2. Roll your hips slightly forward, so your hips are stacked directly over each other and your left / right knee is facing forward. 3. Leading with your heel, lift your top leg 4-6 inches (10-15 cm). You should feel the muscles in your top hip lifting. ? Do not let your foot drift forward. ? Do not let your knee roll toward the ceiling. 4. Hold this position for __________ seconds. 5. Slowly return to the starting position. 6. Let your muscles relax completely between repetitions. Repeat __________ times. Complete this exercise __________ times a day. Straight leg raises, side-lying This exercise strengthens the muscles that move the hip joint toward the center of the  body (hip adductors). 1. Lie on your side with your left / right leg in the bottom position. Lie so your head, shoulder, hip, and knee line up. You may place your upper foot in front to help you balance. 2. Roll your hips slightly forward, so your hips are stacked directly over each other and your left / right knee is facing forward. 3. Tense the muscles in your inner thigh and lift your bottom leg 4-6 inches (10-15 cm). 4. Hold this position for __________ seconds. 5. Slowly return to the starting position. 6. Let your muscles relax completely between repetitions. Repeat __________ times. Complete this exercise __________ times a day. Straight leg raises, supine This exercise strengthens the muscles in the front of your thigh (quadriceps). 1. Lie on your back (supine position) with your left / right leg extended and your other knee bent. 2. Tense the muscles in the front of your left / right thigh. You should see your kneecap slide up or see increased dimpling just above your knee. 3. Keep these muscles tight as you raise your   leg 4-6 inches (10-15 cm) off the floor. Do not let your knee bend. 4. Hold this position for __________ seconds. 5. Keep these muscles tense as you lower your leg. 6. Relax the muscles slowly and completely between repetitions. Repeat __________ times. Complete this exercise __________ times a day. Hip abductors, standing This exercise strengthens the muscles that move the leg and hip joint away from the center of the body (hip abductors). 1. Tie one end of a rubber exercise band or tubing to a secure surface, such as a chair, table, or pole. 2. Loop the other end of the band or tubing around your left / right ankle. 3. Keeping your ankle with the band or tubing directly opposite the secured end, step away until there is tension in the tubing or band. Hold on to a chair, table, or pole as needed for balance. 4. Lift your left / right leg out to your side. While you do  this: ? Keep your back upright. ? Keep your shoulders over your hips. ? Keep your toes pointing forward. ? Make sure to use your hip muscles to slowly lift your leg. Do not tip your body or forcefully lift your leg. 5. Hold this position for __________ seconds. 6. Slowly return to the starting position. Repeat __________ times. Complete this exercise __________ times a day. Squats This exercise strengthens the muscles in the front of your thigh (quadriceps). 1. Stand in a door frame so your feet and knees are in line with the frame. You may place your hands on the frame for balance. 2. Slowly bend your knees and lower your hips like you are going to sit in a chair. ? Keep your lower legs in a straight-up-and-down position. ? Do not let your hips go lower than your knees. ? Do not bend your knees lower than told by your health care provider. ? If your hip pain increases, do not bend as low. 3. Hold this position for ___________ seconds. 4. Slowly push with your legs to return to standing. Do not use your hands to pull yourself to standing. Repeat __________ times. Complete this exercise __________ times a day. This information is not intended to replace advice given to you by your health care provider. Make sure you discuss any questions you have with your health care provider. Document Revised: 02/22/2019 Document Reviewed: 05/30/2018 Elsevier Patient Education  [place]. Hip Bursitis  Hip bursitis is inflammation of a fluid-filled sac (bursa) in the hip joint. The bursa prevents the bones in the hip joint from rubbing against each other. Hip bursitis can cause mild to moderate pain, and symptoms often come and go over time. What are the causes? This condition may be caused by:  Injury to the hip.  Overuse of the muscles that surround the hip joint.  Previous injury or surgery of the hip.  Arthritis or gout.  Diabetes.  Thyroid disease.  Infection. In some cases,  the cause may not be known. What are the signs or symptoms? Symptoms of this condition include:  Mild or moderate pain in the hip area. Pain may get worse with movement.  Tenderness and swelling of the hip, especially on the outer side of the hip.  In rare cases, the bursa may become infected. This may cause a fever, as well as warmth and redness in the area. Symptoms may come and go. How is this diagnosed? This condition may be diagnosed based on:  A physical exam.  Your medical history.  X-rays.  Removal of fluid from your inflamed bursa for testing (biopsy). You may be sent to a health care provider who specializes in bone diseases (orthopedist) or a provider who specializes in joint inflammation (rheumatologist). How is this treated? This condition is treated by resting, icing, applying pressure (compression), and raising (elevating) the injured area. This is called RICE treatment. In some cases, this may be enough to make your symptoms go away. Treatment may also include:  Using crutches.  Draining fluid out of the bursa to help relieve swelling.  Injecting medicine that helps to reduce inflammation (cortisone).  Additional medicines if the bursa is infected. Follow these instructions at home: Managing pain, stiffness, and swelling   If directed, put ice on the painful area. ? Put ice in a plastic bag. ? Place a towel between your skin and the bag. ? Leave the ice on for 20 minutes, 2-3 times a day. ? Raise (elevate) your hip above the level of your heart as much as you can without pain. To do this, try putting a pillow under your hips while you lie down. Activity  Return to your normal activities as told by your health care provider. Ask your health care provider what activities are safe for you.  Rest and protect your hip as much as possible until your pain and swelling get better. General instructions  Take over-the-counter and prescription medicines only as  told by your health care provider.  Wear compression wraps only as told by your health care provider.  Do not use your hip to support your body weight until your health care provider says that you can. Use crutches as told by your health care provider.  Gently massage and stretch your injured area as often as is comfortable.  Keep all follow-up visits as told by your health care provider. This is important. How is this prevented?  Exercise regularly, as told by your health care provider.  Warm up and stretch before being active.  Cool down and stretch after being active.  If an activity irritates your hip or causes pain, avoid the activity as much as possible.  Avoid sitting down for long periods at a time. Contact a health care provider if you:  Have a fever.  Develop new symptoms.  Have difficulty walking or doing everyday activities.  Have pain that gets worse or does not get better with medicine.  Develop red skin or a feeling of warmth in your hip area. Get help right away if you:  Cannot move your hip.  Have severe pain. Summary  Hip bursitis is inflammation of a fluid-filled sac (bursa) in the hip joint.  Hip bursitis can cause mild to moderate pain, and symptoms often come and go over time.  This condition is treated with rest, ice, compression, elevation, and medicines. This information is not intended to replace advice given to you by your health care provider. Make sure you discuss any questions you have with your health care provider. Document Revised: 03/27/2018 Document Reviewed: 03/27/2018 Elsevier Patient Education  [place].

## 2019-09-11 NOTE — Progress Notes (Signed)
   Subjective:    Patient ID: Jane Mclean, female    DOB: 04/03/1956, 64 y.o.   MRN: [id number]  Leg Pain  The pain is present in the left leg, left hip and left thigh. The quality of the pain is described as aching. Exacerbated by: night is worse. Treatments tried: Advil. The treatment provided mild relief.    Pain for the last recent yrs  Pos hx of sacroiliac issues   Adjusting via the chiro has usually helped  Now left side is the worse  Painful at night, pt is a side sleper  Hips hurts  Pain radiates from the hip to the knee  Specific are that hurts the most  Pt wondrs what he Lucianne Lei take  Recent six mo     Pt is walking a lot with work , seems to be worse with hard working rays, hits the [employer] which cna make it worse     Pain and discomfort     Review of Systems No headache, no major weight loss or weight gain, no chest pain no back pain abdominal pain no change in bowel habits complete ROS otherwise negative     Objective:   Physical Exam  Alert vitals stable, NAD. Blood pressure good on repeat. HEENT normal. Lungs clear. Heart regular rate and rhythm. Negative straight leg raise bilateral negative sciatic notch tenderness positive substantial lateral trochanteric tenderness to deep palpation left greater than right      Assessment & Plan:  Impression trochanteric bursitis bilateral discussed orthopedic referral rationale discussed.  Local measures discussed and meantime exercise discussed.  Also has nonspecific left distal foot tightness with normal foot exam orthopedic referral.

## 2019-09-12 ENCOUNTER — Encounter: Payer: Self-pay | Admitting: Family Medicine

## 2019-09-12 ENCOUNTER — Ambulatory Visit: Payer: BC Managed Care – PPO | Admitting: Family Medicine

## 2019-09-12 DIAGNOSIS — M25552 Pain in left hip: Secondary | ICD-10-CM | POA: Diagnosis not present

## 2019-09-12 NOTE — Progress Notes (Signed)
Office Visit Note   Patient: Jane Mclean           Date of Birth: 12-08-1955           MRN: [id number] Visit Date: 09/12/2019 Requested by: Mikey Kirschner, [address] [place],  [place] [address] PCP: Mikey Kirschner, MD  Subjective: Chief Complaint  Patient presents with  . Left Hip - Pain    Pain lateral hip x several months. Wakes her from sleep. Cold sensation in the left thigh at times - gets better with covering the leg with a blanket. Started taking 2 Aleve bid last night per Dr. Wolfgang Phoenix - too early to tell if it is helping.    HPI: She is here with left hip pain.  Symptoms started several months ago, no injury.  Gradual onset of pain in the lateral hip with some radiation toward the knee.  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 Lovena Le in [place].  This pain is different.  Ibuprofen did not make much difference for her.  Lately she has been using prescription strength Aleve but again, still does not notice much difference.  Denies any groin pain.               ROS: No fever or chills, no rash on her skin.  All other systems were reviewed and are negative.  Objective: Vital Signs: There were no vitals taken for this visit.  Physical Exam:  General:  Alert and oriented, in no acute distress. Pulm:  Breathing unlabored. Psy:  Normal mood, congruent affect. Skin: No rash today. Neck: Negative straight leg raise, lower extremity strength and reflexes are normal.  Mild tenderness near the left SI joint today.  No pain with internal hip rotation and she has good hip range of motion.  Imaging: None today  Assessment & Plan: 1.  Left hip greater trochanter syndrome -We discussed various options and she wants to try a cortisone injection since these have helped her in the past. -Once pain improves, I showed her how to do lateral leg raises for strengthening. -If pain persists, consider  physical therapy for ultrasound treatments.     Procedures: Left hip injection: After sterile prep with Betadine, injected 5 cc 1% lidocaine without epinephrine and 40 mg methylprednisolone using a 22-gauge spinal needle, passing the needle into the greater trochanter area at the region of maximum tenderness.  She had very good relief during the immediate anesthetic phase.    PMFS History: Patient Active Problem List   Diagnosis Date Noted  . Screening for colorectal cancer 08/15/2018  . Encounter for gynecological examination with Papanicolaou smear of cervix 08/15/2018  . Elevated serum creatinine 06/16/2017  . Encounter for well woman exam with routine gynecological exam 06/14/2016  . Rotator cuff syndrome of right shoulder 06/06/2013  . Postmenopausal HRT (hormone replacement therapy) 05/21/2013  . Hypothyroid 05/21/2013  . 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 Radicular pain 03/27/2013  . Right hip pain 03/27/2013  . Bursitis of right hip 03/27/2013  . Back pain 03/27/2013  . Unspecified hypothyroidism 03/12/2013  . History of colon cancer 03/12/2013   Past Medical History:  Diagnosis Date  . Colon cancer ([place])   . FH: migraines   . Hypothyroid   . Irregular heart beat   . Postmenopausal HRT (hormone replacement therapy) 05/21/2013  . Rosacea   . Vocal cord atrophy     Family History  Problem Relation  Age of Onset  . Glaucoma Mother   . Parkinson's disease Father   . Macular degeneration Maternal Aunt   . Hypertension Maternal Grandmother   . Stroke Maternal Grandmother   . Crohn's disease Sister   . Migraines Sister   . Other Brother        back problems  . Macular degeneration Maternal Uncle   . Migraines Daughter   . Cancer Sister     Past Surgical History:  Procedure Laterality Date  . CATARACT EXTRACTION  09/2018  . CESAREAN SECTION    . CHOLECYSTECTOMY  1989  . colonectomy  2006   partial  . ENDOMETRIAL ABLATION    . TUBAL LIGATION    . VOCAL  CORD INJECTION     Social History   Occupational History  . Not on file  Tobacco Use  . Smoking status: Never Smoker  . Smokeless tobacco: Never Used  Substance and Sexual Activity  . Alcohol use: No  . Drug use: No  . Sexual activity: Not Currently    Birth control/protection: Surgical, Post-menopausal    Comment: ablation tubal

## 2019-09-30 ENCOUNTER — Ambulatory Visit: Payer: BC Managed Care – PPO | Attending: Internal Medicine

## 2019-09-30 ENCOUNTER — Other Ambulatory Visit: Payer: Self-pay

## 2019-09-30 DIAGNOSIS — Z23 Encounter for immunization: Secondary | ICD-10-CM

## 2019-09-30 NOTE — Progress Notes (Signed)
   Covid-19 Vaccination Clinic  Name:  Marinn Hohenberger    MRN: [id number] DOB: [birth year]  09/30/2019  Ms. Hosseini was observed post Covid-19 immunization for 15 minutes without incidence. She was provided with Vaccine Information Sheet and instruction to access the V-Safe system.   Ms. Swasey was instructed to call 911 with any severe reactions post vaccine: Marland Kitchen Difficulty breathing  . Swelling of your face and throat  . A fast heartbeat  . A bad rash all over your body  . Dizziness and weakness    Immunizations Administered    Name Date Dose VIS Date Route   Moderna COVID-19 Vaccine 09/30/2019  9:51 AM 0.5 mL 07/03/2019 Intramuscular   Manufacturer: Moderna   Lot: [id number]   [id number]

## 2019-10-30 ENCOUNTER — Other Ambulatory Visit (HOSPITAL_COMMUNITY): Payer: Self-pay | Admitting: Adult Health

## 2019-10-30 DIAGNOSIS — Z1231 Encounter for screening mammogram for malignant neoplasm of breast: Secondary | ICD-10-CM

## 2019-11-03 ENCOUNTER — Ambulatory Visit: Payer: BC Managed Care – PPO

## 2019-12-20 ENCOUNTER — Ambulatory Visit (HOSPITAL_COMMUNITY)
Admission: RE | Admit: 2019-12-20 | Discharge: 2019-12-20 | Disposition: A | Discharge status: Home / Self Care | Payer: BC Managed Care – PPO | Source: Ambulatory Visit | Attending: Adult Health | Admitting: Adult Health

## 2019-12-20 ENCOUNTER — Other Ambulatory Visit: Payer: Self-pay

## 2019-12-20 DIAGNOSIS — Z1231 Encounter for screening mammogram for malignant neoplasm of breast: Secondary | ICD-10-CM | POA: Diagnosis present

## 2020-01-07 ENCOUNTER — Encounter: Payer: Self-pay | Admitting: Adult Health

## 2020-01-07 ENCOUNTER — Ambulatory Visit (INDEPENDENT_AMBULATORY_CARE_PROVIDER_SITE_OTHER): Payer: BC Managed Care – PPO | Admitting: Adult Health

## 2020-01-07 VITALS — BP 126/79 | HR 82 | Ht 63.0 in | Wt 160.0 lb

## 2020-01-07 DIAGNOSIS — Z1212 Encounter for screening for malignant neoplasm of rectum: Secondary | ICD-10-CM | POA: Diagnosis not present

## 2020-01-07 DIAGNOSIS — Z1211 Encounter for screening for malignant neoplasm of colon: Secondary | ICD-10-CM | POA: Diagnosis not present

## 2020-01-07 DIAGNOSIS — Z01419 Encounter for gynecological examination (general) (routine) without abnormal findings: Secondary | ICD-10-CM

## 2020-01-07 LAB — HEMOCCULT GUIAC POC 1CARD (OFFICE): Fecal Occult Blood, POC: NEGATIVE

## 2020-01-07 NOTE — Addendum Note (Signed)
Addended by: Derrek Monaco A on: 01/07/2020 03:34 PM   Modules accepted: Orders

## 2020-01-07 NOTE — Progress Notes (Signed)
Patient ID: Jane Mclean, female   DOB: [birth year], 64 y.o.   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 She may retire next year.  PCP is Mickie Hillier.   Current Medications, Allergies, Past Medical History, Past Surgical History, Family History and Social History were reviewed in [employer] record.     Review of Systems:  Patient denies any headaches, hearing loss, fatigue, blurred vision, shortness of breath, chest pain, abdominal pain, problems with bowel movements, urination, or intercourse(not active). No joint pain or mood swings. Still has hot flashes at times   Physical Exam:BP 126/79 (BP Location: Left Arm, Patient Position: Sitting, Cuff Size: Normal)   Pulse 82   Ht 5\' 3"  (1.6 m)   Wt 160 lb (72.6 kg)   BMI 28.34 kg/m  General:  Well developed, well nourished, no acute distress Skin:  Warm and dry Neck:  Midline trachea, normal thyroid, good ROM, no lymphadenopathy,no carotid bruits heard Lungs; Clear to auscultation bilaterally Breast:  No dominant palpable mass, retraction, or nipple discharge Cardiovascular: Regular rate and rhythm Abdomen:  Soft, non tender, no hepatosplenomegaly Pelvic:  External genitalia is normal in appearance, no lesions.  The vagina is normal in appearance. Urethra has no lesions or masses. The cervix is smooth.  Uterus is felt to be normal size, shape, and contour.  No adnexal masses or tenderness noted.Bladder is non tender, no masses felt. Rectal: Good sphincter tone, no polyps, or hemorrhoids felt.  Hemoccult negative. Extremities/musculoskeletal:  No swelling or varicosities noted, no clubbing or cyanosis Psych:  No mood changes, alert and cooperative,seems  happy AA 0 Fall risk is moderate PHQ 9 score is 4, no SI.But has some family stressors, doing better bow. Examination chaperoned by Levy Pupa LPN  Impression and Plan: 1. Encounter for well woman exam with routine gynecological exam Physical in 1 year Pap 2023 Labs with PCP Mammogram yearly  2. Screening for colorectal cancer Colonoscopy per GI

## 2020-03-12 ENCOUNTER — Other Ambulatory Visit: Payer: Self-pay | Admitting: *Deleted

## 2020-03-12 MED ORDER — LEVOTHYROXINE SODIUM 100 MCG PO TABS: 100.0000 ug | ORAL_TABLET | Freq: Every day | ORAL | 0 refills | Status: DC

## 2020-03-17 ENCOUNTER — Ambulatory Visit: Payer: BC Managed Care – PPO | Admitting: Family Medicine

## 2020-03-17 ENCOUNTER — Encounter: Payer: Self-pay | Admitting: Family Medicine

## 2020-03-17 ENCOUNTER — Ambulatory Visit (HOSPITAL_COMMUNITY)
Admission: RE | Admit: 2020-03-17 | Discharge: 2020-03-17 | Disposition: A | Discharge status: Home / Self Care | Payer: BC Managed Care – PPO | Source: Ambulatory Visit | Attending: Family Medicine | Admitting: Family Medicine

## 2020-03-17 ENCOUNTER — Other Ambulatory Visit: Payer: Self-pay

## 2020-03-17 VITALS — BP 122/70 | HR 80 | Temp 98.2°F | Ht 63.0 in | Wt 160.0 lb

## 2020-03-17 DIAGNOSIS — M25562 Pain in left knee: Secondary | ICD-10-CM

## 2020-03-17 DIAGNOSIS — R7989 Other specified abnormal findings of blood chemistry: Secondary | ICD-10-CM

## 2020-03-17 DIAGNOSIS — Z006 Encounter for examination for normal comparison and control in clinical research program: Secondary | ICD-10-CM

## 2020-03-17 DIAGNOSIS — Z85820 Personal history of malignant melanoma of skin: Secondary | ICD-10-CM

## 2020-03-17 DIAGNOSIS — Z13220 Encounter for screening for lipoid disorders: Secondary | ICD-10-CM | POA: Diagnosis not present

## 2020-03-17 DIAGNOSIS — C439 Malignant melanoma of skin, unspecified: Secondary | ICD-10-CM | POA: Insufficient documentation

## 2020-03-17 DIAGNOSIS — Z130 Encounter for screening for diseases of the blood and blood-forming organs and certain disorders involving the immune mechanism: Secondary | ICD-10-CM

## 2020-03-17 DIAGNOSIS — E039 Hypothyroidism, unspecified: Secondary | ICD-10-CM

## 2020-03-17 DIAGNOSIS — M7062 Trochanteric bursitis, left hip: Secondary | ICD-10-CM

## 2020-03-17 MED ORDER — DICLOFENAC SODIUM 75 MG PO TBEC: 75.0000 mg | DELAYED_RELEASE_TABLET | Freq: Two times a day (BID) | ORAL | 0 refills | Status: DC

## 2020-03-17 NOTE — Progress Notes (Signed)
Patient ID: Jane Mclean, female    DOB: May 11, 1956, 64 y.o.   MRN: [id number]   Chief Complaint  Patient presents with  . Hypothyroidism    follow up- also left hip and knee pain at night   Subjective:    HPI   Pt see for f/u on hypothyroidism and knee/hip pain on left. Hurting at night left hip left knee at night.  Has been going on for 1 yr. H/o trochanteric bursitis last year was treated.  No new trauma or injury. Tender still along IT band. Taking pain meds not helping, unable to tolerate strong pain meds. Aleve or advil, not helping. Going on for 1 yr with pain in both. Heat seems to help it. Aches if sleeps on either side.   Pt also doing a research study with [facility] for covid study.  Had cataract surgery 2020. Both eyes.  Has some hot flashes at night, but has had them for over 10 yrs.  Seeing gyn for menopausal concerns.  Hypothyroid- no symptoms, doing well with medications. No palpiations, diarrhea, constipation, sweating, hair loss, hot/cold intolerances.  H/o palpitations- seeing Dr. Harl Bowie, taking cardizem.  Medical History Brinda has a past medical history of Colon cancer ([place]), FH: migraines, Hypothyroid, Irregular heart beat, Melanoma ([place]), Postmenopausal HRT (hormone replacement therapy) (05/21/2013), Rosacea, and Vocal cord atrophy.   Outpatient Encounter Medications as of 03/17/2020  Medication Sig  . Azelaic Acid (FINACEA) 15 % cream Apply topically 2 (two) times daily. After skin is thoroughly washed and patted dry, gently but thoroughly massage a thin film of azelaic acid cream into the affected area twice daily, in the morning and evening.  . Boswellia-Glucosamine-Vit D (OSTEO BI-FLEX ONE PER DAY PO) Take by mouth.  . diclofenac (VOLTAREN) 75 MG EC tablet Take 1 tablet (75 mg total) by mouth 2 (two) times daily.  Marland Kitchen diltiazem (CARDIZEM) 30 MG tablet Take 30 mg two times daily. May take an extra 30 mg as needed for  palpitations.  Marland Kitchen levothyroxine (SYNTHROID) 100 MCG tablet Take 1 tablet (100 mcg total) by mouth daily.   No facility-administered encounter medications on file as of 03/17/2020.     Review of Systems  Constitutional: Negative for chills and fever.  HENT: Negative for congestion, rhinorrhea and sore throat.   Respiratory: Negative for cough, shortness of breath and wheezing.   Cardiovascular: Negative for chest pain and leg swelling.  Gastrointestinal: Negative for abdominal pain, diarrhea, nausea and vomiting.  Genitourinary: Negative for dysuria and frequency.  Musculoskeletal: Positive for arthralgias (left hip and knee pain-chronic). Negative for back pain.  Skin: Negative for rash.  Neurological: Negative for dizziness, weakness and headaches.     Vitals BP 122/70   Pulse 80   Temp 98.2 F (36.8 C) (Oral)   Ht _0  (1.6 m)   Wt 160 lb (72.6 kg)   SpO2 100%   BMI 28.34 kg/m   Objective:   Physical Exam Vitals and nursing note reviewed.  Constitutional:      General: She is not in acute distress.    Appearance: Normal appearance.  HENT:     Head: Normocephalic and atraumatic.     Nose: Nose normal.     Mouth/Throat:     Mouth: Mucous membranes are moist.     Pharynx: Oropharynx is clear.  Eyes:     Extraocular Movements: Extraocular movements intact.     Conjunctiva/sclera: Conjunctivae normal.     Pupils: Pupils are equal, round,  and reactive to light.  Cardiovascular:     Rate and Rhythm: Normal rate and regular rhythm.     Pulses: Normal pulses.     Heart sounds: Normal heart sounds.  Pulmonary:     Effort: Pulmonary effort is normal. No respiratory distress.     Breath sounds: Normal breath sounds. No wheezing, rhonchi or rales.  Musculoskeletal:        General: Normal range of motion.     Right lower leg: No edema.     Left lower leg: No edema.     Comments: +normal rom of left hip, neg pelvic rocking.  Normal rom back.  No ttp over spinous  processes.  +crepitus of left knee, normal rom. No swelling.   Skin:    General: Skin is warm and dry.     Findings: No lesion or rash.  Neurological:     General: No focal deficit present.     Mental Status: She is alert and oriented to person, place, and time.  Psychiatric:        Mood and Affect: Mood normal.        Behavior: Behavior normal.      Assessment and Plan   1. Acute pain of left knee - DG Knee Complete 4 Views Left; Future - diclofenac (VOLTAREN) 75 MG EC tablet; Take 1 tablet (75 mg total) by mouth 2 (two) times daily.  Dispense: 30 tablet; Refill: 0 - DG Knee Complete 4 Views Left  2. Hypothyroidism, unspecified type - TSH  3. Elevated serum creatinine - CMP14+EGFR  4. Lipid screening - Lipid panel  5. Screening for deficiency anemia - CBC  6. Patient in clinical research study  7. History of malignant melanoma of skin  8. Trochanteric bursitis of left hip - diclofenac (VOLTAREN) 75 MG EC tablet; Take 1 tablet (75 mg total) by mouth 2 (two) times daily.  Dispense: 30 tablet; Refill: 0   Hypothyroidism- pt to get labs this week.  Will call if needing to make changes. Elevated Cr- in past, in 2018, but resolved and normal after recheck.  Will cont to monitor.  Pt to get labs this week.  Call with results.   Left knee pain- Ordered xray left knee.  Start diclofenac for left hip and knee pain. If worsening pain call or rto.  Pt in agreement.  F/u 1 yr or prn.

## 2020-03-19 LAB — LIPID PANEL
Chol/HDL Ratio: 3.2 ratio (ref 0.0–4.4)
Cholesterol, Total: 184 mg/dL (ref 100–199)
HDL: 58 mg/dL (ref >39)
LDL Chol Calc (NIH): 114 mg/dL — ABNORMAL HIGH (ref 0–99)
Triglycerides: 63 mg/dL (ref 0–149)
VLDL Cholesterol Cal: 12 mg/dL (ref 5–40)

## 2020-03-19 LAB — CMP14+EGFR
ALT: 14 IU/L (ref 0–32)
AST: 16 IU/L (ref 0–40)
Albumin/Globulin Ratio: 1.8 (ref 1.2–2.2)
Albumin: 4.3 g/dL (ref 3.8–4.8)
Alkaline Phosphatase: 110 IU/L (ref 48–121)
BUN/Creatinine Ratio: 17 (ref 12–28)
BUN: 17 mg/dL (ref 8–27)
Bilirubin Total: 0.3 mg/dL (ref 0.0–1.2)
CO2: 24 mmol/L (ref 20–29)
Calcium: 9.2 mg/dL (ref 8.7–10.3)
Chloride: 104 mmol/L (ref 96–106)
Creatinine, Ser: 1.03 mg/dL — ABNORMAL HIGH (ref 0.57–1.00)
GFR calc Af Amer: 67 mL/min/{1.73_m2} (ref >59)
GFR calc non Af Amer: 58 mL/min/{1.73_m2} — ABNORMAL LOW (ref >59)
Globulin, Total: 2.4 g/dL (ref 1.5–4.5)
Glucose: 91 mg/dL (ref 65–99)
Potassium: 4.4 mmol/L (ref 3.5–5.2)
Sodium: 140 mmol/L (ref 134–144)
Total Protein: 6.7 g/dL (ref 6.0–8.5)

## 2020-03-19 LAB — CBC
Hematocrit: 37.4 % (ref 34.0–46.6)
Hemoglobin: 12.6 g/dL (ref 11.1–15.9)
MCH: 30.7 pg (ref 26.6–33.0)
MCHC: 33.7 g/dL (ref 31.5–35.7)
MCV: 91 fL (ref 79–97)
Platelets: 422 10*3/uL (ref 150–450)
RBC: 4.1 x10E6/uL (ref 3.77–5.28)
RDW: 13.1 % (ref 11.7–15.4)
WBC: 6.7 10*3/uL (ref 3.4–10.8)

## 2020-03-19 LAB — TSH: TSH: 2.49 u[IU]/mL (ref 0.450–4.500)

## 2020-03-21 ENCOUNTER — Ambulatory Visit: Payer: BC Managed Care – PPO | Admitting: Cardiology

## 2020-03-21 ENCOUNTER — Encounter: Payer: Self-pay | Admitting: Cardiology

## 2020-03-21 ENCOUNTER — Other Ambulatory Visit: Payer: Self-pay

## 2020-03-21 VITALS — BP 122/80 | HR 69 | Ht 63.0 in | Wt 160.0 lb

## 2020-03-21 DIAGNOSIS — R002 Palpitations: Secondary | ICD-10-CM | POA: Diagnosis not present

## 2020-03-21 DIAGNOSIS — R000 Tachycardia, unspecified: Secondary | ICD-10-CM

## 2020-03-21 NOTE — Patient Instructions (Signed)
Medication Instructions:  Your physician recommends that you continue on your current medications as directed. Please refer to the Current Medication list given to you today.  *If you need a refill on your cardiac medications before your next appointment, please call your pharmacy*   Lab Work: NONE   If you have labs (blood work) drawn today and your tests are completely normal, you will receive your results only by: . MyChart Message (if you have MyChart) OR . A paper copy in the mail If you have any lab test that is abnormal or we need to change your treatment, we will call you to review the results.   Testing/Procedures: NONE    Follow-Up: At [facility], you and your health needs are our priority.  As part of our continuing mission to provide you with exceptional heart care, we have created designated Provider Care Teams.  These Care Teams include your primary Cardiologist (physician) and Advanced Practice Providers (APPs -  Physician Assistants and Nurse Practitioners) who all work together to provide you with the care you need, when you need it.  We recommend signing up for the patient portal called "MyChart".  Sign up information is provided on this After Visit Summary.  MyChart is used to connect with patients for Virtual Visits (Telemedicine).  Patients are able to view lab/test results, encounter notes, upcoming appointments, etc.  Non-urgent messages can be sent to your provider as well.   To learn more about what you can do with MyChart, go to [email or web address].    Your next appointment:   1 year(s)  The format for your next appointment:   In Person  Provider:   Jonathan Branch, MD   Other Instructions Thank you for choosing [facility]!    

## 2020-03-21 NOTE — Progress Notes (Signed)
Clinical Summary Ms. Tingley is a 64 y.o.female seen today for follow up of the following medical problems.  1. Palpitations - ER visit 01/02/17 with symptoms. K 3.7, TSH 2.1, trop neg. EKG sinus tachycardia.   - completed heart monitor which showed PACs with short runs of atach, few PVCs.   - no recent troubles, tolearint diltiazem. 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 Husband is a Theme park manager  Part of [place] study at [facility].  Have had covid vaccine.   Past Medical History:  Diagnosis Date  . Colon cancer ([place])   . FH: migraines   . Hypothyroid   . Irregular heart beat   . Melanoma ([place])   . Postmenopausal HRT (hormone replacement therapy) 05/21/2013  . Rosacea   . Vocal cord atrophy      Allergies  Allergen Reactions  . Amoxicillin   . Ampicillin   . Hydrocodone-Acetaminophen Nausea And Vomiting  . Other Other (See Comments)  . Oxycodone   . Phenazopyridine Nausea And Vomiting  . Pyridium [Phenazopyridine Hcl] Nausea And Vomiting  . Shellfish Allergy   . Wound Dressing Adhesive Hives and Itching  . Penicillins Hives and Rash    Has patient had a PCN reaction causing immediate rash, facial/tongue/throat swelling, SOB or lightheadedness with hypotension: Yes Has patient had a PCN reaction causing severe rash involving mucus membranes or skin necrosis: Yes Has patient had a PCN reaction that required hospitalization: No Has patient had a PCN reaction occurring within the last 10 years: No If all of the above answers are "NO", then may proceed with Cephalosporin use.  . Tramadol Rash     Current Outpatient Medications  Medication Sig Dispense Refill  . Azelaic Acid (FINACEA) 15 % cream Apply topically 2 (two) times daily. After skin is thoroughly washed and patted dry, gently but thoroughly massage a thin film of azelaic acid cream into the affected area twice  daily, in the morning and evening.    . Boswellia-Glucosamine-Vit D (OSTEO BI-FLEX ONE PER DAY PO) Take by mouth.    . diclofenac (VOLTAREN) 75 MG EC tablet Take 1 tablet (75 mg total) by mouth 2 (two) times daily. 30 tablet 0  . diltiazem (CARDIZEM) 30 MG tablet Take 30 mg two times daily. May take an extra 30 mg as needed for palpitations. 270 tablet 3  . levothyroxine (SYNTHROID) 100 MCG tablet Take 1 tablet (100 mcg total) by mouth daily. 90 tablet 0   No current facility-administered medications for this visit.     Past Surgical History:  Procedure Laterality Date  . CATARACT EXTRACTION  09/2018  . CESAREAN SECTION    . CHOLECYSTECTOMY  1989  . colonectomy  2006   partial  . ENDOMETRIAL ABLATION    . SKIN CANCER EXCISION    . TUBAL LIGATION    . VOCAL CORD INJECTION       Allergies  Allergen Reactions  . Amoxicillin   . Ampicillin   . Hydrocodone-Acetaminophen Nausea And Vomiting  . Other Other (See Comments)  . Oxycodone   . Phenazopyridine Nausea And Vomiting  . Pyridium [Phenazopyridine Hcl] Nausea And Vomiting  . Shellfish Allergy   . Wound Dressing Adhesive Hives and Itching  . Penicillins Hives and Rash    Has patient had a PCN reaction causing immediate rash, facial/tongue/throat swelling, SOB or lightheadedness with hypotension: Yes Has patient had a PCN  reaction causing severe rash involving mucus membranes or skin necrosis: Yes Has patient had a PCN reaction that required hospitalization: No Has patient had a PCN reaction occurring within the last 10 years: No If all of the above answers are "NO", then may proceed with Cephalosporin use.  . Tramadol Rash      Family History  Problem Relation Age of Onset  . Glaucoma Mother   . Parkinson's disease Father   . Macular degeneration Maternal Aunt   . Hypertension Maternal Grandmother   . Stroke Maternal Grandmother   . Crohn's disease Sister   . Migraines Sister   . Cancer Sister        anal  . Other  Brother        back problems  . Macular degeneration Maternal Uncle   . Migraines Daughter   . Hodgkin's lymphoma Son      Social History Ms. Schnelle reports that she has never smoked. She has never used smokeless tobacco. Ms. Mayden reports no history of alcohol use.   Review of Systems CONSTITUTIONAL: No weight loss, fever, chills, weakness or fatigue.  HEENT: Eyes: No visual loss, blurred vision, double vision or yellow sclerae.No hearing loss, sneezing, congestion, runny nose or sore throat.  SKIN: No rash or itching.  CARDIOVASCULAR: per hpi RESPIRATORY: No shortness of breath, cough or sputum.  GASTROINTESTINAL: No anorexia, nausea, vomiting or diarrhea. No abdominal pain or blood.  GENITOURINARY: No burning on urination, no polyuria NEUROLOGICAL: No headache, dizziness, syncope, paralysis, ataxia, numbness or tingling in the extremities. No change in bowel or bladder control.  MUSCULOSKELETAL: No muscle, back pain, joint pain or stiffness.  LYMPHATICS: No enlarged nodes. No history of splenectomy.  PSYCHIATRIC: No history of depression or anxiety.  ENDOCRINOLOGIC: No reports of sweating, cold or heat intolerance. No polyuria or polydipsia.  Marland Kitchen   Physical Examination Today's Vitals   03/21/20 0821  BP: 122/80  Pulse: 69  SpO2: 100%  Weight: 160 lb (72.6 kg)  Height: 5\' 3"  (1.6 m)   Body mass index is 28.34 kg/m.  Gen: resting comfortably, no acute distress HEENT: no scleral icterus, pupils equal round and reactive, no palptable cervical adenopathy,  CV: RRR, no m/rg, no jvd Resp: Clear to auscultation bilaterally GI: abdomen is soft, non-tender, non-distended, normal bowel sounds, no hepatosplenomegaly MSK: extremities are warm, no edema.  Skin: warm, no rash Neuro:  no focal deficits Psych: appropriate affect     Assessment and Plan  1. Palpitations/Tachycardia -prior heart monitor with PACs, PVCs, and short run of atach -had done well on lopressor  but reports episodes of heart burn she attributes to the medication, from review up to 1% of patients can have this side effect. Symptoms resolved with changing to dilt - continue current meds, doing well - EKG today shows NSR   F/u 1 year      Arnoldo Lenis, M.D.

## 2020-04-16 ENCOUNTER — Other Ambulatory Visit: Payer: Self-pay | Admitting: Cardiology

## 2020-04-16 MED ORDER — DILTIAZEM HCL 30 MG PO TABS: ORAL_TABLET | ORAL | 3 refills | Status: AC

## 2020-04-16 NOTE — Telephone Encounter (Signed)
Refill complete 

## 2020-04-16 NOTE — Telephone Encounter (Signed)
New message     *STAT* If patient is at the pharmacy, call can be transferred to refill team.   1. Which medications need to be refilled? (please list name of each medication and dose if known) diltiazem (CARDIZEM) 30 MG tablet  2. Which pharmacy/location (including street and city if local pharmacy) is medication to be sent to? [facility] on [address]   3. Do they need a 30 day or 90 day supply?  [place]

## 2020-06-10 ENCOUNTER — Other Ambulatory Visit: Payer: Self-pay | Admitting: Family Medicine

## 2020-09-26 IMAGING — DX DG KNEE COMPLETE 4+V*L*
4 series · 4 of 4 positions shown · non-contrast
Comparison: None.

CLINICAL DATA: Pain

EXAM:
LEFT KNEE - COMPLETE 4+ VIEW

[knee ap]
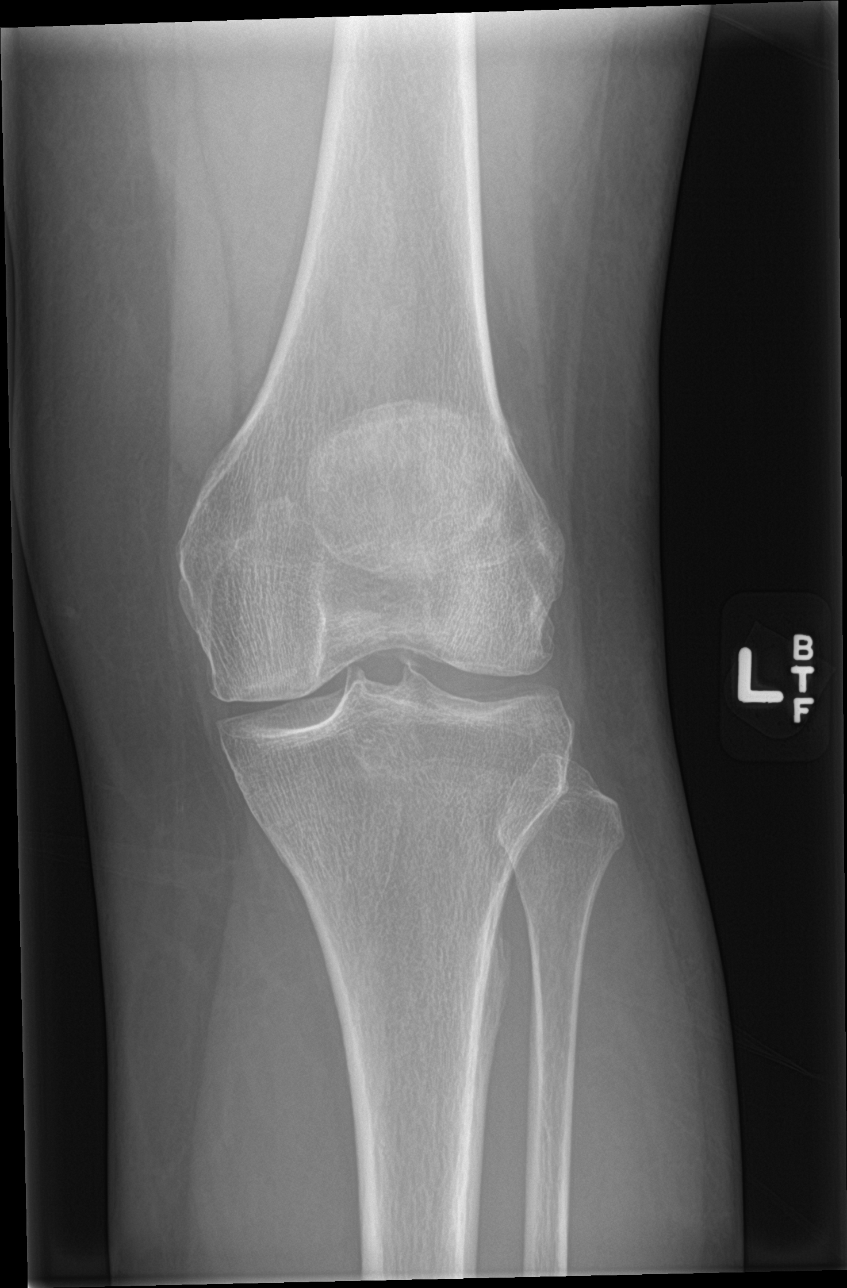

[knee obl (1 of 2)]
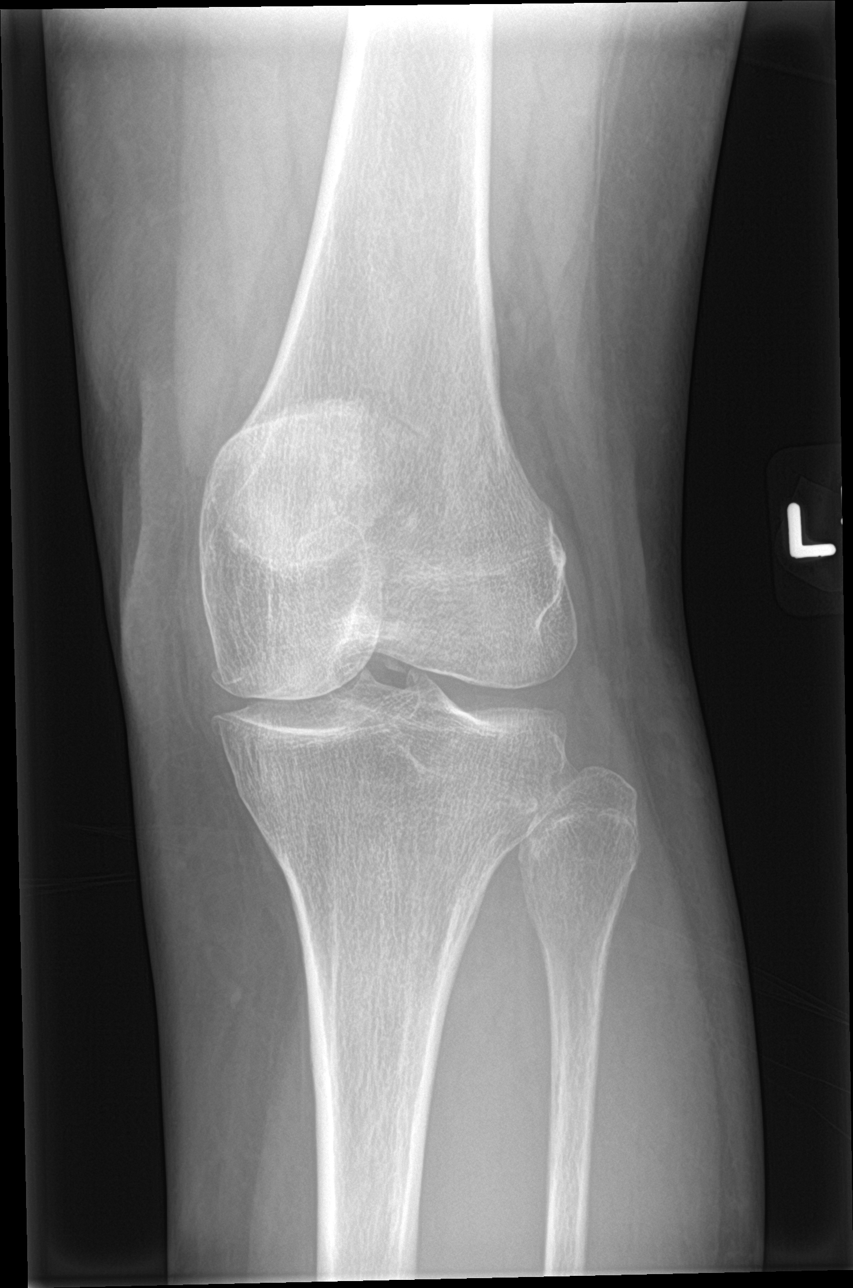

[knee obl (2 of 2)]
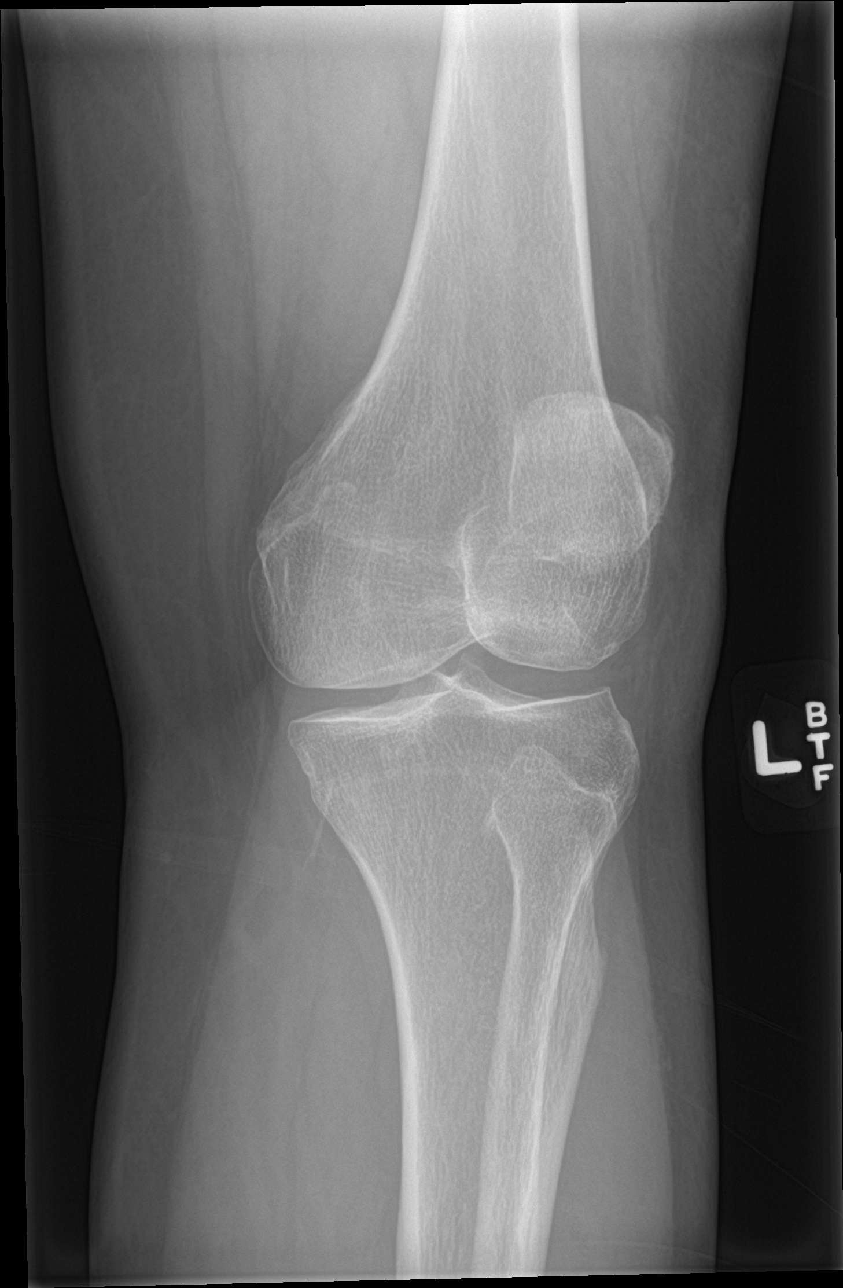

[knee lat]
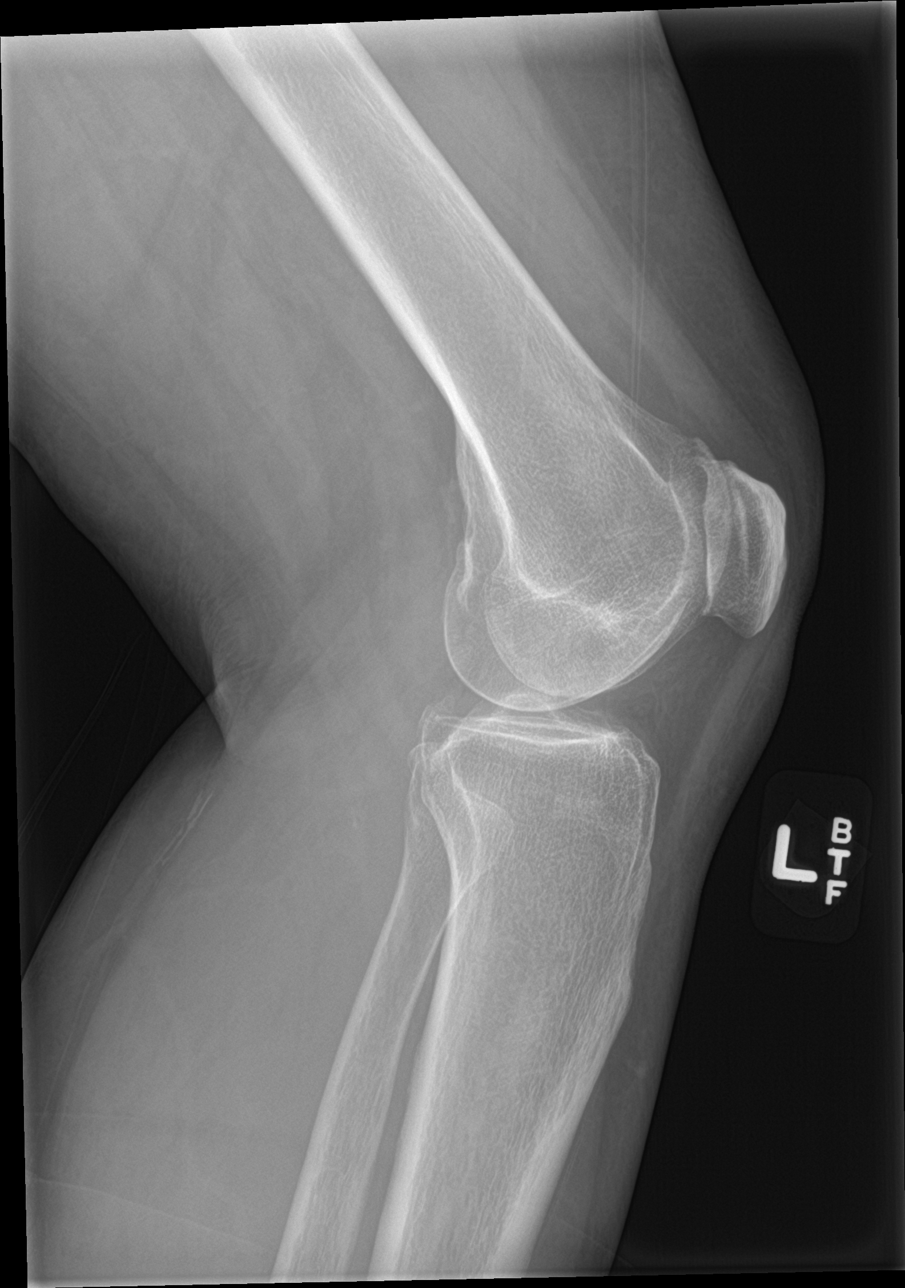

[4 of 4 positions shown; findings below may reference images not displayed]

FINDINGS: Frontal, lateral, and bilateral oblique views were obtained. There
is no fracture or dislocation. No joint effusion. There is moderate
narrowing of the patellofemoral joint. Other joint spaces appear
normal. No erosive change.
IMPRESSION: Moderate narrowing patellofemoral joint. Other joint spaces appear
unremarkable. No evident fracture, dislocation, or joint effusion.

## 2020-10-01 DIAGNOSIS — Z029 Encounter for administrative examinations, unspecified: Secondary | ICD-10-CM

## 2021-01-07 ENCOUNTER — Other Ambulatory Visit: Payer: BC Managed Care – PPO | Admitting: Adult Health

## 2021-03-06 ENCOUNTER — Ambulatory Visit: Payer: BC Managed Care – PPO | Admitting: Family Medicine

## 2021-03-06 ENCOUNTER — Other Ambulatory Visit: Payer: Self-pay

## 2021-03-06 VITALS — BP 121/64 | HR 87 | Temp 97.3°F | Ht 63.0 in | Wt 164.0 lb

## 2021-03-06 DIAGNOSIS — E039 Hypothyroidism, unspecified: Secondary | ICD-10-CM

## 2021-03-06 DIAGNOSIS — R7989 Other specified abnormal findings of blood chemistry: Secondary | ICD-10-CM

## 2021-03-06 DIAGNOSIS — I471 Supraventricular tachycardia: Secondary | ICD-10-CM | POA: Insufficient documentation

## 2021-03-06 DIAGNOSIS — I493 Ventricular premature depolarization: Secondary | ICD-10-CM | POA: Diagnosis not present

## 2021-03-06 MED ORDER — LEVOTHYROXINE SODIUM 100 MCG PO TABS: ORAL_TABLET | ORAL | 1 refills | Status: DC

## 2021-03-06 NOTE — Progress Notes (Signed)
Patient ID: Jane Mclean, female    DOB: [birth year], 65 y.o.   MRN: [id number]   Chief Complaint  Patient presents with   Hypothyroidism    Follow up   Subjective:    HPI Hypothyroidism- Not having any side effects from thyroid meds.  Compliant with meds. No diarrhea, constipation, depression/anxiety, palpitations, excessive hair loss or weight gain/loss. No heat/cold intolerance.  2020- dx with PVCs and atrial tachycardia-seeing cards, with Dr. Harl Mclean, taking cardizem '30mg'$ .   Medical History Jane Mclean has a past medical history of Colon cancer [facility]), FH: migraines, Hypothyroid, Irregular heart beat, Melanoma (Jane Mclean), Postmenopausal HRT (hormone replacement therapy) (05/21/2013), Rosacea, and Vocal cord atrophy.   Outpatient Encounter Medications as of 03/06/2021  Medication Sig   Azelaic Acid (FINACEA) 15 % cream Apply topically 2 (two) times daily. After skin is thoroughly washed and patted dry, gently but thoroughly massage a thin film of azelaic acid cream into the affected area twice daily, in the morning and evening.   diclofenac (VOLTAREN) 75 MG EC tablet Take 75 mg by mouth daily.   diltiazem (CARDIZEM) 30 MG tablet Take 30 mg two times daily. May take an extra 30 mg as needed for palpitations.   levothyroxine (SYNTHROID) 100 MCG tablet TAKE 1 TABLET(100 MCG) BY MOUTH DAILY   [DISCONTINUED] Boswellia-Glucosamine-Vit D (OSTEO BI-FLEX ONE PER DAY PO) Take 1 tablet by mouth daily.    [DISCONTINUED] levothyroxine (SYNTHROID) 100 MCG tablet TAKE 1 TABLET(100 MCG) BY MOUTH DAILY   No facility-administered encounter medications on file as of 03/06/2021.     Review of Systems  Constitutional:  Negative for chills and fever.  HENT:  Negative for congestion, rhinorrhea and sore throat.   Respiratory:  Negative for cough, shortness of breath and wheezing.   Cardiovascular:  Negative for chest pain and leg swelling.  Gastrointestinal:  Negative for abdominal pain, diarrhea, nausea and  vomiting.  Genitourinary:  Negative for dysuria and frequency.  Musculoskeletal:  Negative for arthralgias and back pain.  Skin:  Negative for rash.  Neurological:  Negative for dizziness, weakness and headaches.    Vitals BP 121/64   Pulse 87   Temp (!) 97.3 F (36.3 C) (Oral)   Ht '5\' 3"'$  (1.6 m)   Wt 164 lb (74.4 kg)   BMI 29.05 kg/m   Objective:   Physical Exam Vitals and nursing note reviewed.  Constitutional:      Appearance: Normal appearance.  HENT:     Head: Normocephalic and atraumatic.     Nose: Nose normal.     Mouth/Throat:     Mouth: Mucous membranes are moist.     Pharynx: Oropharynx is clear.  Eyes:     Extraocular Movements: Extraocular movements intact.     Conjunctiva/sclera: Conjunctivae normal.     Pupils: Pupils are equal, round, and reactive to light.  Cardiovascular:     Rate and Rhythm: Normal rate and regular rhythm.     Pulses: Normal pulses.     Heart sounds: Normal heart sounds.  Pulmonary:     Effort: Pulmonary effort is normal.     Breath sounds: Normal breath sounds. No wheezing, rhonchi or rales.  Musculoskeletal:        General: Normal range of motion.     Cervical back: Normal range of motion and neck supple.     Right lower leg: No edema.     Left lower leg: No edema.  Skin:    General: Skin is warm and dry.  Findings: No lesion or rash.  Neurological:     General: No focal deficit present.     Mental Status: She is alert and oriented to person, place, and time.  Psychiatric:        Mood and Affect: Mood normal.        Behavior: Behavior normal.     Assessment and Plan   1. Hypothyroidism, unspecified type - TSH - levothyroxine (SYNTHROID) 100 MCG tablet; TAKE 1 TABLET(100 MCG) BY MOUTH DAILY  Dispense: 90 tablet; Refill: 1  2. PVC (premature ventricular contraction)  3. Atrial tachycardia (HCC)  4. Elevated serum creatinine - Basic metabolic panel   H/o atrial tachycardia and pvc's- Seeing cardiology- taking  1 tab cardizem in am and pm and then taking 1 extra if needed.  Pt taking cardizem '30mg'$   H/o elevate Cr- , in 8/21- Cr 1.03.  will recheck bmp.  Hypothyroid- stable. Last tsh-1.84.   Cont meds.  Return in about 6 months (around 09/06/2021) for f/u thyroid.

## 2021-03-07 ENCOUNTER — Other Ambulatory Visit: Payer: Self-pay | Admitting: Family Medicine

## 2021-03-07 DIAGNOSIS — E039 Hypothyroidism, unspecified: Secondary | ICD-10-CM

## 2021-03-07 LAB — BASIC METABOLIC PANEL
BUN/Creatinine Ratio: 29 — ABNORMAL HIGH (ref 12–28)
BUN: 29 mg/dL — ABNORMAL HIGH (ref 8–27)
CO2: 23 mmol/L (ref 20–29)
Calcium: 9 mg/dL (ref 8.7–10.3)
Chloride: 100 mmol/L (ref 96–106)
Creatinine, Ser: 1.01 mg/dL — ABNORMAL HIGH (ref 0.57–1.00)
Glucose: 78 mg/dL (ref 65–99)
Potassium: 4.3 mmol/L (ref 3.5–5.2)
Sodium: 138 mmol/L (ref 134–144)
eGFR: 63 mL/min/{1.73_m2} (ref >59)

## 2021-03-07 LAB — TSH: TSH: 1.84 u[IU]/mL (ref 0.450–4.500)

## 2021-08-30 ENCOUNTER — Other Ambulatory Visit: Payer: Self-pay | Admitting: Family Medicine

## 2021-08-30 DIAGNOSIS — E039 Hypothyroidism, unspecified: Secondary | ICD-10-CM
